# Patient Record
Sex: Male | Born: 1940
Health system: Southern US, Community
[De-identification: ages and names within clinical notes are randomized; demographics above are authoritative.]

## PROBLEM LIST (undated history)

## (undated) DIAGNOSIS — E785 Hyperlipidemia, unspecified: Secondary | ICD-10-CM

## (undated) DIAGNOSIS — R7302 Impaired glucose tolerance (oral): Secondary | ICD-10-CM

## (undated) DIAGNOSIS — E559 Vitamin D deficiency, unspecified: Secondary | ICD-10-CM

## (undated) DIAGNOSIS — Z8249 Family history of ischemic heart disease and other diseases of the circulatory system: Secondary | ICD-10-CM

## (undated) DIAGNOSIS — I493 Ventricular premature depolarization: Secondary | ICD-10-CM

## (undated) DIAGNOSIS — R9431 Abnormal electrocardiogram [ECG] [EKG]: Secondary | ICD-10-CM

## (undated) DIAGNOSIS — N200 Calculus of kidney: Secondary | ICD-10-CM

## (undated) DIAGNOSIS — I10 Essential (primary) hypertension: Secondary | ICD-10-CM

## (undated) HISTORY — DX: Vitamin D deficiency, unspecified: E55.9

## (undated) HISTORY — PX: LITHOTRIPSY: SUR834

## (undated) HISTORY — DX: Family history of ischemic heart disease and other diseases of the circulatory system: Z82.49

## (undated) HISTORY — DX: Impaired glucose tolerance (oral): R73.02

## (undated) HISTORY — DX: Essential (primary) hypertension: I10

## (undated) HISTORY — PX: CYSTOSCOPY/RETROGRADE/URETEROSCOPY/STONE EXTRACTION WITH BASKET: SHX5317

## (undated) HISTORY — DX: Abnormal electrocardiogram (ECG) (EKG): R94.31

## (undated) HISTORY — PX: KIDNEY STONE SURGERY: SHX686

## (undated) HISTORY — DX: Ventricular premature depolarization: I49.3

## (undated) HISTORY — DX: Hyperlipidemia, unspecified: E78.5

---

## 1998-04-01 ENCOUNTER — Ambulatory Visit (HOSPITAL_COMMUNITY): Admission: RE | Admit: 1998-04-01 | Discharge: 1998-04-01 | Payer: Self-pay | Admitting: Cardiovascular Disease

## 1999-04-30 ENCOUNTER — Ambulatory Visit (HOSPITAL_COMMUNITY): Admission: RE | Admit: 1999-04-30 | Discharge: 1999-04-30 | Payer: Self-pay | Admitting: Gastroenterology

## 2002-01-18 ENCOUNTER — Encounter: Payer: Self-pay | Admitting: *Deleted

## 2002-01-19 ENCOUNTER — Ambulatory Visit (HOSPITAL_BASED_OUTPATIENT_CLINIC_OR_DEPARTMENT_OTHER): Admission: RE | Admit: 2002-01-19 | Discharge: 2002-01-19 | Payer: Self-pay | Admitting: *Deleted

## 2002-01-25 ENCOUNTER — Ambulatory Visit (HOSPITAL_COMMUNITY): Admission: RE | Admit: 2002-01-25 | Discharge: 2002-01-25 | Payer: Self-pay | Admitting: *Deleted

## 2002-02-01 ENCOUNTER — Ambulatory Visit (HOSPITAL_COMMUNITY): Admission: RE | Admit: 2002-02-01 | Discharge: 2002-02-01 | Payer: Self-pay | Admitting: *Deleted

## 2002-02-02 ENCOUNTER — Encounter: Payer: Self-pay | Admitting: *Deleted

## 2002-02-02 ENCOUNTER — Ambulatory Visit (HOSPITAL_BASED_OUTPATIENT_CLINIC_OR_DEPARTMENT_OTHER): Admission: RE | Admit: 2002-02-02 | Discharge: 2002-02-02 | Payer: Self-pay | Admitting: *Deleted

## 2002-03-02 ENCOUNTER — Ambulatory Visit (HOSPITAL_BASED_OUTPATIENT_CLINIC_OR_DEPARTMENT_OTHER): Admission: RE | Admit: 2002-03-02 | Discharge: 2002-03-02 | Payer: Self-pay | Admitting: *Deleted

## 2002-03-02 ENCOUNTER — Encounter: Payer: Self-pay | Admitting: *Deleted

## 2002-08-08 ENCOUNTER — Ambulatory Visit (HOSPITAL_COMMUNITY): Admission: RE | Admit: 2002-08-08 | Discharge: 2002-08-08 | Payer: Self-pay | Admitting: Gastroenterology

## 2011-11-01 ENCOUNTER — Emergency Department (HOSPITAL_COMMUNITY)
Admission: EM | Admit: 2011-11-01 | Discharge: 2011-11-01 | Disposition: A | Discharge status: Home / Self Care | Payer: BC Managed Care – PPO | Source: Home / Self Care

## 2011-11-01 ENCOUNTER — Encounter: Payer: Self-pay | Admitting: *Deleted

## 2011-11-01 DIAGNOSIS — M316 Other giant cell arteritis: Secondary | ICD-10-CM

## 2011-11-01 HISTORY — DX: Essential (primary) hypertension: I10

## 2011-11-01 HISTORY — DX: Calculus of kidney: N20.0

## 2011-11-01 LAB — SEDIMENTATION RATE: Sed Rate: 22 mm/hr — ABNORMAL HIGH (ref 0–16)

## 2011-11-01 MED ORDER — METHYLPREDNISOLONE SODIUM SUCC 125 MG IJ SOLR
INTRAMUSCULAR | Status: AC
  Filled 2011-11-01: qty 2

## 2011-11-01 MED ORDER — PREDNISONE 10 MG PO TABS: ORAL_TABLET | ORAL | Status: DC

## 2011-11-01 MED ORDER — METHYLPREDNISOLONE SODIUM SUCC 125 MG IJ SOLR
125.0000 mg | Freq: Once | INTRAMUSCULAR | Status: AC
  Administered 2011-11-01: 125 mg via INTRAMUSCULAR

## 2011-11-01 NOTE — ED Notes (Signed)
Pt with c/o pain right temple area onset x 1.5 weeks called his dr Thursday started on azithromycin - denies sinus congestion - pt had tooth pulled Monday - pain head started same time as toothache

## 2011-11-01 NOTE — ED Provider Notes (Signed)
History     CSN: [id number]  Arrival date & time 11/01/11  4656   First MD Initiated Contact with Patient 11/01/11 [phone]      Chief Complaint  Patient presents with  . Headache    (Consider location/radiation/quality/duration/timing/severity/associated sxs/prior treatment) HPI Comments: Pt c/o Rt temple HA x 1- 1 1/2 weeks. Pain is constant though wax and wanes in intensity. Throbbing discomfort. Also has noticed that his temple is tender to the touch. Pain currently 4, but at worst is a 7. No radiation of pain, dizziness or parasthesias. Denies blurred or dble vision or other visual changes. No change in hearing or tinnitus. He has hydrocodone from a recent tooth extraction that he has been taking for pain with some relief. No progression or worsening noted.   Patient is a 70 y.o. male presenting with headaches.  Headache The primary symptoms include headaches. Primary symptoms do not include dizziness or fever.  The headache is not associated with photophobia, eye pain or weakness.  Additional symptoms do not include weakness, photophobia, hearing loss or tinnitus.    Past Medical History  Diagnosis Date  . Hypertension   . Kidney stone     Past Surgical History  Procedure Date  . Kidney stone surgery     History reviewed. No pertinent family history.  History  Substance Use Topics  . Smoking status: Never Smoker   . Smokeless tobacco: Not on file  . Alcohol Use: No      Review of Systems  Constitutional: Negative for fever, chills and appetite change.  HENT: Negative for hearing loss, ear pain, congestion, sore throat, facial swelling, mouth sores, neck pain, sinus pressure and tinnitus.   Eyes: Negative for photophobia, pain and visual disturbance.  Respiratory: Negative for cough, chest tightness and shortness of breath.   Cardiovascular: Negative for chest pain.  Neurological: Positive for headaches. Negative for dizziness, weakness and numbness.     Allergies  Review of patient's allergies indicates no known allergies.  Home Medications   Current Outpatient Rx  Name Route Sig Dispense Refill  . FENOFIBRATE 145 MG PO TABS Oral Take 145 mg by mouth daily.      . FENOFIBRATE 50 MG PO TABS Oral Take 60 mg by mouth daily.      Marland Kitchen VALSARTAN-HYDROCHLOROTHIAZIDE 160-12.5 MG PO TABS Oral Take 1 tablet by mouth daily.      Marland Kitchen PREDNISONE 10 MG PO TABS  Take 3 tabs daily x 2 days, then 2 tabs daily 12 tablet 0    BP 179/72  Pulse 65  Temp(Src) 98.9 F (37.2 C) (Oral)  Resp 17  SpO2 100%  Physical Exam  Nursing note and vitals reviewed. Constitutional: He appears well-developed and well-nourished. No distress.  HENT:  Head: Normocephalic and atraumatic.  Right Ear: Tympanic membrane, external ear and ear canal normal.  Left Ear: Tympanic membrane, external ear and ear canal normal.  Nose: Nose normal.  Mouth/Throat: Uvula is midline, oropharynx is clear and moist and mucous membranes are normal. No oropharyngeal exudate, posterior oropharyngeal edema or posterior oropharyngeal erythema.  Eyes: Conjunctivae, EOM and lids are normal.  Fundoscopic exam:      The right eye shows no hemorrhage and no papilledema.       The left eye shows no hemorrhage and no papilledema.  Neck: Neck supple.  Cardiovascular: Normal rate, regular rhythm and normal heart sounds.   Pulmonary/Chest: Effort normal and breath sounds normal. No respiratory distress.  Lymphadenopathy:    He  has no cervical adenopathy.  Neurological: He is alert. He has normal strength. No cranial nerve deficit. Gait normal.  Reflex Scores:      Bicep reflexes are 2+ on the right side and 2+ on the left side. Skin: Skin is warm and dry.  Psychiatric: He has a normal mood and affect.    ED Course  Procedures (including critical care time)   Labs Reviewed  SEDIMENTATION RATE   No results found.   1. Temporal arteritis       MDM  Rt temporal HA - suspected  temporal arteritis. No visual changes.        Carmel [place], [place] 11/01/11 1219

## 2011-11-01 NOTE — ED Provider Notes (Signed)
Medical screening examination/treatment/procedure(s) were performed by non-physician practitioner and as supervising physician I was immediately available for consultation/collaboration.  Francina Ames, MD 11/01/11 [phone]

## 2011-11-02 NOTE — ED Notes (Signed)
Sed Rate 22 H. Shown to [place]. Roselyn Meier 11/02/2011

## 2015-05-17 DIAGNOSIS — Z125 Encounter for screening for malignant neoplasm of prostate: Secondary | ICD-10-CM | POA: Diagnosis not present

## 2015-05-24 DIAGNOSIS — N5201 Erectile dysfunction due to arterial insufficiency: Secondary | ICD-10-CM | POA: Diagnosis not present

## 2015-05-24 DIAGNOSIS — R3916 Straining to void: Secondary | ICD-10-CM | POA: Diagnosis not present

## 2015-05-24 DIAGNOSIS — N401 Enlarged prostate with lower urinary tract symptoms: Secondary | ICD-10-CM | POA: Diagnosis not present

## 2015-10-24 DIAGNOSIS — Z23 Encounter for immunization: Secondary | ICD-10-CM | POA: Diagnosis not present

## 2015-10-24 DIAGNOSIS — E784 Other hyperlipidemia: Secondary | ICD-10-CM | POA: Diagnosis not present

## 2015-10-24 DIAGNOSIS — I10 Essential (primary) hypertension: Secondary | ICD-10-CM | POA: Diagnosis not present

## 2015-10-24 DIAGNOSIS — Z0000 Encounter for general adult medical examination without abnormal findings: Secondary | ICD-10-CM | POA: Diagnosis not present

## 2015-10-24 DIAGNOSIS — R7309 Other abnormal glucose: Secondary | ICD-10-CM | POA: Diagnosis not present

## 2016-11-11 ENCOUNTER — Other Ambulatory Visit: Payer: Self-pay

## 2016-11-11 NOTE — Telephone Encounter (Signed)
SENT NOTES TO SCHEDULING 

## 2016-11-24 ENCOUNTER — Encounter (INDEPENDENT_AMBULATORY_CARE_PROVIDER_SITE_OTHER): Payer: Self-pay

## 2016-11-24 ENCOUNTER — Encounter: Payer: Self-pay | Admitting: Cardiology

## 2016-11-24 ENCOUNTER — Ambulatory Visit (INDEPENDENT_AMBULATORY_CARE_PROVIDER_SITE_OTHER): Payer: Medicare Other | Admitting: Cardiology

## 2016-11-24 VITALS — BP 138/80 | HR 68 | Ht 69.0 in | Wt 183.0 lb

## 2016-11-24 DIAGNOSIS — I10 Essential (primary) hypertension: Secondary | ICD-10-CM | POA: Diagnosis not present

## 2016-11-24 DIAGNOSIS — R9431 Abnormal electrocardiogram [ECG] [EKG]: Secondary | ICD-10-CM | POA: Diagnosis not present

## 2016-11-24 DIAGNOSIS — Z8249 Family history of ischemic heart disease and other diseases of the circulatory system: Secondary | ICD-10-CM

## 2016-11-24 DIAGNOSIS — E7800 Pure hypercholesterolemia, unspecified: Secondary | ICD-10-CM | POA: Diagnosis not present

## 2016-11-24 DIAGNOSIS — E782 Mixed hyperlipidemia: Secondary | ICD-10-CM | POA: Insufficient documentation

## 2016-11-24 DIAGNOSIS — R7302 Impaired glucose tolerance (oral): Secondary | ICD-10-CM | POA: Insufficient documentation

## 2016-11-24 DIAGNOSIS — E785 Hyperlipidemia, unspecified: Secondary | ICD-10-CM

## 2016-11-24 DIAGNOSIS — I493 Ventricular premature depolarization: Secondary | ICD-10-CM

## 2016-11-24 DIAGNOSIS — E559 Vitamin D deficiency, unspecified: Secondary | ICD-10-CM | POA: Insufficient documentation

## 2016-11-24 HISTORY — DX: Hyperlipidemia, unspecified: E78.5

## 2016-11-24 HISTORY — DX: Essential (primary) hypertension: I10

## 2016-11-24 HISTORY — DX: Abnormal electrocardiogram (ECG) (EKG): R94.31

## 2016-11-24 HISTORY — DX: Family history of ischemic heart disease and other diseases of the circulatory system: Z82.49

## 2016-11-24 HISTORY — DX: Ventricular premature depolarization: I49.3

## 2016-11-24 NOTE — Progress Notes (Signed)
Cardiology Office Note    Date:  11/24/2016   ID:  Eddie Hutchinson, DOB 03/02/1941, MRN [id number]  PCP:  Salena Saner., MD  Cardiologist:  Fransico Him, MD   No chief complaint on file.   History of Present Illness:  Eddie Hutchinson is a 76 y.o. male with a history of HTN, hyperlipidemia and glucose intolerance who presents who recently saw his PCP for routine OV and was noted to have an abnormal EKG with LAFB, LVH and PVCs.  He is a former smoker and has a family history of his father having am MI at 57   His Framingham risk score was calculated at >30 and is now referred for evaluation.  He denies any chest pain or pressure.  He denies any SOB, DOE, PND, orthopnea, LE edema, dizziness, palpitations or syncope.  He denies any claudication symptoms.    Past Medical History:  Diagnosis Date  . Abnormal EKG 11/24/2016   LAFB with LVH and QRS widening  . Benign essential HTN 11/24/2016  . Family history of early CAD 11/24/2016  . Glucose intolerance (impaired glucose tolerance)   . Hyperlipidemia   . Hyperlipidemia 11/24/2016  . Hypertension   . Kidney stone   . PVC's (premature ventricular contractions) 11/24/2016  . Vitamin D deficiency     Past Surgical History:  Procedure Laterality Date  . CYSTOSCOPY/RETROGRADE/URETEROSCOPY/STONE EXTRACTION WITH BASKET    . KIDNEY STONE SURGERY    . LITHOTRIPSY      Current Medications: Outpatient Medications Prior to Visit  Medication Sig Dispense Refill  . valsartan-hydrochlorothiazide (DIOVAN-HCT) 160-12.5 MG per tablet Take 1 tablet by mouth daily.      . fenofibrate (TRICOR) 145 MG tablet Take 145 mg by mouth daily.      . fenofibrate (TRIGLIDE) 50 MG tablet Take 60 mg by mouth daily.      . predniSONE (DELTASONE) 10 MG tablet Take 3 tabs daily x 2 days, then 2 tabs daily 12 tablet 0   No facility-administered medications prior to visit.      Allergies:   Bee venom   Social History   Social History  . Marital status:  Married    Spouse name: N/A  . Number of children: N/A  . Years of education: N/A   Social History Main Topics  . Smoking status: Former Research scientist (life sciences)  . Smokeless tobacco: Never Used  . Alcohol use Yes  . Drug use: No  . Sexual activity: Not Asked   Other Topics Concern  . None   Social History Narrative  . None     Family History:  The patient's family history includes CVA in his brother; Cancer in his brother; Heart attack (age of onset: 13) in his father; Heart disease in his father; Heart failure in his mother.   ROS:   Please see the history of present illness.    ROS All other systems reviewed and are negative.  No flowsheet data found.     PHYSICAL EXAM:   VS:  BP 138/80   Pulse 68   Ht $R'5\' 9"'dP$  (1.753 m)    GEN: Well nourished, well developed, in no acute distress  HEENT: normal  Neck: no JVD, carotid bruits, or masses Cardiac: RRR; no murmurs, rubs, or gallops,no edema.  Intact distal pulses bilaterally.  Respiratory:  clear to auscultation bilaterally, normal work of breathing GI: soft, nontender, nondistended, + BS MS: no deformity or atrophy  Skin: warm and dry, no rash Neuro:  Alert  and Oriented x 3, Strength and sensation are intact Psych: euthymic mood, full affect  Wt Readings from Last 3 Encounters:  No data found for Wt      Studies/Labs Reviewed:   EKG:  EKG is ordered today.  The ekg ordered today demonstrates NSR at 65bpm with LAFB and LVH with QRS widening  Recent Labs: No results found for requested labs within last 8760 hours.   Lipid Panel No results found for: CHOL, TRIG, HDL, CHOLHDL, VLDL, LDLCALC, LDLDIRECT  Additional studies/ records that were reviewed today include:  Office notes from PCP    ASSESSMENT:    1. Abnormal EKG   2. Benign essential HTN   3. Pure hypercholesterolemia   4. Family history of early CAD   65. PVC's (premature ventricular contractions)      PLAN:  In order of problems listed above:  1.  Abnormal  EKG with LVH with QRS widening and LAFB.  He has no anginal symptoms but has multiple CRFs including HTN, hyperlipidemia, family history of CAD at an early age and remote tobacco use.  I will get a Lexiscan myoview to rule out ischemia.  I will also get a 2D echo to assess LVF and EF.   2.  HTN - BP controlled on current meds.  Continue ARB and diuretic.  3.  Hyperlipidemia - followed by PCP - continue fenofibrate. 4.  Family history of CAD - see #1 5.  PVC's - noted on EKG at PCP office and asymptomatic.   Medication Adjustments/Labs and Tests Ordered: Current medicines are reviewed at length with the patient today.  Concerns regarding medicines are outlined above.  Medication changes, Labs and Tests ordered today are listed in the Patient Instructions below.  There are no Patient Instructions on file for this visit.   Signed, Fransico Him, MD  11/24/2016 9:21 AM    [address], [place], [place]  [address] Phone: [phone]; Fax: [phone]

## 2016-11-24 NOTE — Patient Instructions (Signed)
Medication Instructions:  Your physician recommends that you continue on your current medications as directed. Please refer to the Current Medication list given to you today.   Labwork: None  Testing/Procedures: Your physician has requested that you have an echocardiogram. Echocardiography is a painless test that uses sound waves to create images of your heart. It provides your doctor with information about the size and shape of your heart and how well your heart's chambers and valves are working. This procedure takes approximately one hour. There are no restrictions for this procedure.   Dr. Linsey recommends you have a NUCLEAR STRESS TEST.  Follow-Up: Your physician recommends that you schedule a follow-up appointment AS NEEDED with Dr. Hitchcock pending study results.  Any Other Special Instructions Will Be Listed Below (If Applicable).     If you need a refill on your cardiac medications before your next appointment, please call your pharmacy.   

## 2016-12-02 ENCOUNTER — Encounter: Payer: Self-pay | Admitting: Cardiology

## 2016-12-08 ENCOUNTER — Telehealth (HOSPITAL_COMMUNITY): Payer: Self-pay | Admitting: *Deleted

## 2016-12-08 NOTE — Telephone Encounter (Signed)
Left message on voicemail per DPR in reference to upcoming appointment scheduled on 12/10/16 at 0730 with detailed instructions given per Myocardial Perfusion Study Information Sheet for the test. LM to arrive 15 minutes early, and that it is imperative to arrive on time for appointment to keep from having the test rescheduled. If you need to cancel or reschedule your appointment, please call the office within 24 hours of your appointment. Failure to do so may result in a cancellation of your appointment, and a $50 no show fee. Phone number given for call back for any questions.

## 2016-12-10 ENCOUNTER — Other Ambulatory Visit: Payer: Self-pay

## 2016-12-10 ENCOUNTER — Ambulatory Visit (HOSPITAL_BASED_OUTPATIENT_CLINIC_OR_DEPARTMENT_OTHER): Payer: Medicare Other

## 2016-12-10 ENCOUNTER — Ambulatory Visit (HOSPITAL_COMMUNITY): Payer: Medicare Other | Attending: Cardiovascular Disease

## 2016-12-10 DIAGNOSIS — Z8249 Family history of ischemic heart disease and other diseases of the circulatory system: Secondary | ICD-10-CM

## 2016-12-10 DIAGNOSIS — R9431 Abnormal electrocardiogram [ECG] [EKG]: Secondary | ICD-10-CM | POA: Insufficient documentation

## 2016-12-10 LAB — MYOCARDIAL PERFUSION IMAGING
CHL CUP RESTING HR STRESS: 50 {beats}/min
LVDIAVOL: 116 mL (ref 62–150)
LVSYSVOL: 57 mL
NUC STRESS TID: 0.96
Peak HR: 96 {beats}/min
RATE: 0.28
SDS: 2
SRS: 6
SSS: 8

## 2016-12-10 LAB — ECHOCARDIOGRAM COMPLETE
HEIGHTINCHES: 69 in
Weight: 2928 oz

## 2016-12-10 MED ORDER — TECHNETIUM TC 99M TETROFOSMIN IV KIT
10.2000 | PACK | Freq: Once | INTRAVENOUS | Status: AC | PRN
  Administered 2016-12-10: 10.2 via INTRAVENOUS
  Filled 2016-12-10: qty 11

## 2016-12-10 MED ORDER — TECHNETIUM TC 99M TETROFOSMIN IV KIT
32.6000 | PACK | Freq: Once | INTRAVENOUS | Status: AC | PRN
  Administered 2016-12-10: 32.6 via INTRAVENOUS
  Filled 2016-12-10: qty 33

## 2016-12-10 MED ORDER — REGADENOSON 0.4 MG/5ML IV SOLN
0.4000 mg | Freq: Once | INTRAVENOUS | Status: AC
  Administered 2016-12-10: 0.4 mg via INTRAVENOUS

## 2018-10-20 ENCOUNTER — Other Ambulatory Visit: Payer: Self-pay | Admitting: Nurse Practitioner

## 2018-10-20 MED ORDER — VALSARTAN-HYDROCHLOROTHIAZIDE 160-12.5 MG PO TABS: 1.0000 | ORAL_TABLET | Freq: Every day | ORAL | 0 refills | Status: DC

## 2018-11-07 ENCOUNTER — Encounter: Payer: Self-pay | Admitting: Nurse Practitioner

## 2018-11-07 ENCOUNTER — Ambulatory Visit (INDEPENDENT_AMBULATORY_CARE_PROVIDER_SITE_OTHER): Payer: Medicare Other | Admitting: Nurse Practitioner

## 2018-11-07 VITALS — BP 140/68 | HR 61 | Temp 98.4°F | Ht 68.2 in | Wt 187.8 lb

## 2018-11-07 DIAGNOSIS — E782 Mixed hyperlipidemia: Secondary | ICD-10-CM | POA: Diagnosis not present

## 2018-11-07 DIAGNOSIS — Z125 Encounter for screening for malignant neoplasm of prostate: Secondary | ICD-10-CM | POA: Diagnosis not present

## 2018-11-07 DIAGNOSIS — R7309 Other abnormal glucose: Secondary | ICD-10-CM | POA: Diagnosis not present

## 2018-11-07 DIAGNOSIS — Z0000 Encounter for general adult medical examination without abnormal findings: Secondary | ICD-10-CM | POA: Diagnosis not present

## 2018-11-07 DIAGNOSIS — Z23 Encounter for immunization: Secondary | ICD-10-CM | POA: Diagnosis not present

## 2018-11-07 DIAGNOSIS — I10 Essential (primary) hypertension: Secondary | ICD-10-CM | POA: Diagnosis not present

## 2018-11-07 LAB — POCT URINALYSIS DIPSTICK
BILIRUBIN UA: NEGATIVE
Blood, UA: NEGATIVE
Glucose, UA: NEGATIVE
KETONES UA: NEGATIVE
Leukocytes, UA: NEGATIVE
Nitrite, UA: NEGATIVE
PROTEIN UA: NEGATIVE
Spec Grav, UA: 1.025 (ref 1.010–1.025)
Urobilinogen, UA: 0.2 E.U./dL
pH, UA: 5.5 (ref 5.0–8.0)

## 2018-11-07 LAB — POCT UA - MICROALBUMIN
Albumin/Creatinine Ratio, Urine, POC: 30
CREATININE, POC: 300 mg/dL
Microalbumin Ur, POC: 30 mg/L

## 2018-11-07 MED ORDER — ZOSTER VAC RECOMB ADJUVANTED 50 MCG/0.5ML IM SUSR: 0.5000 mL | Freq: Once | INTRAMUSCULAR | 0 refills | Status: AC

## 2018-11-07 MED ORDER — PNEUMOCOCCAL 13-VAL CONJ VACC IM SUSP: 0.5000 mL | INTRAMUSCULAR | 0 refills | Status: AC

## 2018-11-07 NOTE — Patient Instructions (Signed)
  Eddie Hutchinson , Thank you for taking time to come for your Medicare Wellness Visit. I appreciate your ongoing commitment to your health goals. Please review the following plan we discussed and let me know if I can assist you in the future.   These are the goals we discussed: Goals    . Exercise 150 min/wk Moderate Activity     Would like to exercise more.         This is a list of the screening recommended for you and due dates:  Health Maintenance  Topic Date Due  . Tetanus Vaccine  05/03/1961  . Pneumonia vaccines (1 of 2 - PCV13) 05/04/2007  . Flu Shot  06/09/2018

## 2018-11-07 NOTE — Progress Notes (Signed)
Subjective:     Patient ID: Eddie Hutchinson , male    DOB: 1941/05/21 , 77 y.o.   MRN: [id number]   Chief Complaint  Patient presents with  . Medicare Wellness   Men's preventive visit. Patient Health Questionnaire (PHQ-2) is    Clinical Support from 11/07/2018 in [facility]  PHQ-2 Total Score  0    . Patient is on a regular diet. Marital status: Married. Relevant history for alcohol use is:  Social History   Substance and Sexual Activity  Alcohol Use Yes  . Relevant history for tobacco use is:  Social History   Tobacco Use  Smoking Status Former Smoker  Smokeless Tobacco Never Used    HPI  Hypertension  This is a chronic problem. The current episode started more than 1 year ago. The problem is unchanged. The problem is controlled. Pertinent negatives include no anxiety, chest pain, headaches, malaise/fatigue, palpitations or shortness of breath. There are no associated agents to hypertension. Risk factors for coronary artery disease include male gender, obesity and sedentary lifestyle. Past treatments include angiotensin blockers and diuretics. The current treatment provides significant improvement. Compliance problems include exercise.  There is no history of angina or kidney disease. There is no history of chronic renal disease.     Past Medical History:  Diagnosis Date  . Abnormal EKG 11/24/2016   LAFB with LVH and QRS widening  . Benign essential HTN 11/24/2016  . Family history of early CAD 11/24/2016  . Glucose intolerance (impaired glucose tolerance)   . Hyperlipidemia   . Hyperlipidemia 11/24/2016  . Hypertension   . Kidney stone   . PVC's (premature ventricular contractions) 11/24/2016  . Vitamin D deficiency      Family History  Problem Relation Age of Onset  . Heart failure Mother   . Heart attack Father 24  . Heart disease Father   . CVA Brother   . Cancer Brother      Current Outpatient Medications:  .  Ergocalciferol (VITAMIN  D2) 2000 units TABS, Take 2,000 Units by mouth daily., Disp: , Rfl:  .  fenofibrate 160 MG tablet, Take 160 mg by mouth daily., Disp: , Rfl: 4 .  valsartan-hydrochlorothiazide (DIOVAN-HCT) 160-12.5 MG tablet, Take 1 tablet by mouth daily., Disp: 90 tablet, Rfl: 0   Allergies  Allergen Reactions  . Bee Venom Hives    As a child      Review of Systems  Constitutional: Negative.  Negative for malaise/fatigue.  HENT: Negative.   Eyes: Negative.   Respiratory: Negative.  Negative for shortness of breath.   Cardiovascular: Negative.  Negative for chest pain, palpitations and leg swelling.  Gastrointestinal: Negative.   Endocrine: Negative.   Genitourinary: Negative.   Musculoskeletal: Negative.   Skin: Negative.   Allergic/Immunologic: Negative.   Neurological: Negative.  Negative for dizziness and headaches.  Hematological: Negative.   Psychiatric/Behavioral: Negative.      Today's Vitals   11/07/18 0849  BP: 140/68  Pulse: 61  Temp: 98.4 F (36.9 C)  TempSrc: Oral  SpO2: 97%  Weight: 187 lb 12.8 oz (85.2 kg)  Height: 5' 8.2" (1.732 m)  PainSc: 0-No pain   Body mass index is 28.39 kg/m.   Objective:  Physical Exam Vitals signs reviewed.  Constitutional:      Appearance: Normal appearance. He is obese.  HENT:     Head: Normocephalic and atraumatic.     Right Ear: Tympanic membrane, ear canal and external ear normal. There is  no impacted cerumen.     Left Ear: Tympanic membrane, ear canal and external ear normal. There is no impacted cerumen.     Nose: Nose normal. No congestion or rhinorrhea.     Mouth/Throat:     Mouth: Mucous membranes are moist.  Eyes:     Extraocular Movements: Extraocular movements intact.     Conjunctiva/sclera: Conjunctivae normal.     Pupils: Pupils are equal, round, and reactive to light.  Neck:     Musculoskeletal: Normal range of motion and neck supple. No muscular tenderness.  Cardiovascular:     Rate and Rhythm: Normal rate and  regular rhythm.     Heart sounds: No murmur.  Pulmonary:     Effort: Pulmonary effort is normal.     Breath sounds: Normal breath sounds.  Abdominal:     General: Abdomen is flat.  Musculoskeletal: Normal range of motion.  Skin:    General: Skin is warm and dry.     Capillary Refill: Capillary refill takes less than 2 seconds.  Neurological:     General: No focal deficit present.     Mental Status: He is alert and oriented to person, place, and time.  Psychiatric:        Mood and Affect: Mood normal.         Assessment And Plan:     1. Encounter for Medicare annual wellness exam . Behavior modifications discussed and diet history reviewed.   . Pt will continue to exercise regularly and modify diet with low GI, plant based foods and decrease intake of processed foods.  . Recommend intake of daily multivitamin, Vitamin D, and calcium.  . Recommend for preventive screenings, as well as recommend immunizations that include influenza, TDAP (up to date) and Shingles  2. Encounter for prostate cancer screening  - PSA  3. Encounter for immunization  Sent prescription for Shingrix to pharmacy and advised to not get the prevnar 13 and shingrix on the same day.  4. Benign essential HTN . B/P is controlled.  . CMP ordered to check renal function.  . The importance of regular exercise and dietary modification was stressed to the patient.  . Stressed importance of losing ten percent of her body weight to help with B/P control.  . The weight loss would help with decreasing cardiac and cancer risk as well.  - EKG 12-Lead - POCT Urinalysis Dipstick (81002) - POCT UA - Microalbumin - Basic metabolic panel  5. Mixed hyperlipidemia  Chronic, controlled  Continue with current medications - Hepatic function panel - Lipid panel  6. Abnormal glucose  Chronic, stable  No current medications  Encouraged to limit intake of sugary foods and drinks  Encouraged to increase physical  activity to 150 minutes per week - Hemoglobin A1c      [name], FNP    Subjective:   Eddie Hutchinson is a 77 y.o. male who presents for Medicare Annual/Subsequent preventive examination.  Review of Systems:  See above       Objective:    Vitals: BP 140/68 (BP Location: Right Arm, Patient Position: Sitting, Cuff Size: Large)   Pulse 61   Temp 98.4 F (36.9 C) (Oral)   Ht 5' 8.2" (1.732 m)   Wt 187 lb 12.8 oz (85.2 kg)   SpO2 97%   BMI 28.39 kg/m   Body mass index is 28.39 kg/m.  No flowsheet data found.  Tobacco Social History   Tobacco Use  Smoking Status Former Smoker  Smokeless Tobacco Never Used     Counseling given: Not Answered   Clinical Intake:     Pain Score: 0-No pain                 Past Medical History:  Diagnosis Date  . Abnormal EKG 11/24/2016   LAFB with LVH and QRS widening  . Benign essential HTN 11/24/2016  . Family history of early CAD 11/24/2016  . Glucose intolerance (impaired glucose tolerance)   . Hyperlipidemia   . Hyperlipidemia 11/24/2016  . Hypertension   . Kidney stone   . PVC's (premature ventricular contractions) 11/24/2016  . Vitamin D deficiency    Past Surgical History:  Procedure Laterality Date  . CYSTOSCOPY/RETROGRADE/URETEROSCOPY/STONE EXTRACTION WITH BASKET    . KIDNEY STONE SURGERY    . LITHOTRIPSY     Family History  Problem Relation Age of Onset  . Heart failure Mother   . Heart attack Father 58  . Heart disease Father   . CVA Brother   . Cancer Brother    Social History   Socioeconomic History  . Marital status: Married    Spouse name: Not on file  . Number of children: Not on file  . Years of education: Not on file  . Highest education level: Not on file  Occupational History  . Not on file  Social Needs  . Financial resource strain: Not on file  . Food insecurity:    Worry: Not on file    Inability: Not on file  . Transportation needs:    Medical: Not on file     Non-medical: Not on file  Tobacco Use  . Smoking status: Former Research scientist (life sciences)  . Smokeless tobacco: Never Used  Substance and Sexual Activity  . Alcohol use: Yes  . Drug use: No  . Sexual activity: Not on file  Lifestyle  . Physical activity:    Days per week: Not on file    Minutes per session: Not on file  . Stress: Not on file  Relationships  . Social connections:    Talks on phone: Not on file    Gets together: Not on file    Attends religious service: Not on file    Active member of club or organization: Not on file    Attends meetings of clubs or organizations: Not on file    Relationship status: Not on file  Other Topics Concern  . Not on file  Social History Narrative  . Not on file    Outpatient Encounter Medications as of 11/07/2018  Medication Sig  . Ergocalciferol (VITAMIN D2) 2000 units TABS Take 2,000 Units by mouth daily.  . fenofibrate 160 MG tablet Take 160 mg by mouth daily.  . valsartan-hydrochlorothiazide (DIOVAN-HCT) 160-12.5 MG tablet Take 1 tablet by mouth daily.   No facility-administered encounter medications on file as of 11/07/2018.     Activities of Daily Living In your present state of health, do you have any difficulty performing the following activities: 11/07/2018  Hearing? N  Vision? N  Difficulty concentrating or making decisions? N  Walking or climbing stairs? N  Dressing or bathing? N  Doing errands, shopping? N  Some recent data might be hidden    Patient Care Team: [name], FNP as PCP - General (General Practice)   Assessment:   This is a routine wellness examination for Aristotle.  Exercise Activities and Dietary recommendations    Goals   None     Fall Risk Fall Risk  11/07/2018  Falls in the past year? 0   Is the patient's home free of loose throw rugs in walkways, pet beds, electrical cords, etc?   yes      Grab bars in the bathroom? yes      Handrails on the stairs?   NA      Adequate lighting?   yes  Timed  Get Up and Go Performed: less than 3 seconds  Depression Screen PHQ 2/9 Scores 11/07/2018  PHQ - 2 Score 0    Cognitive Function       Mini-Cog - 11/07/18 0847    Normal clock drawing test?  yes    How many words correct?  3       Immunization History  Administered Date(s) Administered  . Influenza-Unspecified 10/08/2018  . Pneumococcal-Unspecified 07/14/2012  . Tdap 05/30/2010    Qualifies for Shingles Vaccine? Has had previously.    Screening Tests Health Maintenance  Topic Date Due  . TETANUS/TDAP  05/03/1961  . PNA vac Low Risk Adult (1 of 2 - PCV13) 05/04/2007  . INFLUENZA VACCINE  06/09/2018   Cancer Screenings: Lung: Low Dose CT Chest recommended if Age 76-80 years, 30 pack-year currently smoking OR have quit w/in 15years. Patient does not qualify. Colorectal: aged out   Additional Screenings:  Hepatitis C Screening:       Plan:   See above  I have personally reviewed and noted the following in the patient's chart:   . Medical and social history . Use of alcohol, tobacco or illicit drugs  . Current medications and supplements . Functional ability and status . Nutritional status . Physical activity . Advanced directives . List of other physicians . Hospitalizations, surgeries, and ER visits in previous 12 months . Vitals . Screenings to include cognitive, depression, and falls . Referrals and appointments  In addition, I have reviewed and discussed with patient certain preventive protocols, quality metrics, and best practice recommendations. A written personalized care plan for preventive services as well as general preventive health recommendations were provided to patient.     [name], FNP  11/07/2018

## 2018-11-08 LAB — HEPATIC FUNCTION PANEL
ALT: 16 IU/L (ref 0–44)
AST: 18 IU/L (ref 0–40)
Albumin: 4.2 g/dL (ref 3.5–4.8)
Alkaline Phosphatase: 36 IU/L — ABNORMAL LOW (ref 39–117)
BILIRUBIN TOTAL: 0.2 mg/dL (ref 0.0–1.2)
BILIRUBIN, DIRECT: 0.09 mg/dL (ref 0.00–0.40)
TOTAL PROTEIN: 6.6 g/dL (ref 6.0–8.5)

## 2018-11-08 LAB — PSA: Prostate Specific Ag, Serum: 1 ng/mL (ref 0.0–4.0)

## 2018-11-08 LAB — LIPID PANEL
CHOLESTEROL TOTAL: 133 mg/dL (ref 100–199)
Chol/HDL Ratio: 3.2 ratio (ref 0.0–5.0)
HDL: 42 mg/dL (ref >39)
LDL CALC: 75 mg/dL (ref 0–99)
TRIGLYCERIDES: 79 mg/dL (ref 0–149)
VLDL CHOLESTEROL CAL: 16 mg/dL (ref 5–40)

## 2018-11-08 LAB — BASIC METABOLIC PANEL
BUN / CREAT RATIO: 12 (ref 10–24)
BUN: 22 mg/dL (ref 8–27)
CALCIUM: 10.5 mg/dL — AB (ref 8.6–10.2)
CHLORIDE: 106 mmol/L (ref 96–106)
CO2: 22 mmol/L (ref 20–29)
CREATININE: 1.88 mg/dL — AB (ref 0.76–1.27)
GFR calc Af Amer: 39 mL/min/{1.73_m2} — ABNORMAL LOW (ref >59)
GFR calc non Af Amer: 34 mL/min/{1.73_m2} — ABNORMAL LOW (ref >59)
GLUCOSE: 90 mg/dL (ref 65–99)
Potassium: 4.9 mmol/L (ref 3.5–5.2)
Sodium: 144 mmol/L (ref 134–144)

## 2018-11-08 LAB — HEMOGLOBIN A1C
ESTIMATED AVERAGE GLUCOSE: 134 mg/dL
Hgb A1c MFr Bld: 6.3 % — ABNORMAL HIGH (ref 4.8–5.6)

## 2018-11-28 ENCOUNTER — Telehealth: Payer: Self-pay

## 2018-11-28 NOTE — Telephone Encounter (Signed)
Mr. Hyland called back and I have relayed the lab results to him. He did not have any other questions.   Thanks, [employer]

## 2018-11-30 ENCOUNTER — Other Ambulatory Visit: Payer: Self-pay | Admitting: Nurse Practitioner

## 2018-11-30 DIAGNOSIS — N183 Chronic kidney disease, stage 3 unspecified: Secondary | ICD-10-CM

## 2018-11-30 DIAGNOSIS — I10 Essential (primary) hypertension: Secondary | ICD-10-CM

## 2019-01-16 ENCOUNTER — Other Ambulatory Visit: Payer: Self-pay | Admitting: Nurse Practitioner

## 2019-01-21 ENCOUNTER — Other Ambulatory Visit: Payer: Self-pay | Admitting: Nurse Practitioner

## 2019-04-12 ENCOUNTER — Other Ambulatory Visit: Payer: Self-pay | Admitting: Nephrology

## 2019-04-12 DIAGNOSIS — N183 Chronic kidney disease, stage 3 unspecified: Secondary | ICD-10-CM

## 2019-04-15 ENCOUNTER — Other Ambulatory Visit: Payer: Self-pay | Admitting: Nurse Practitioner

## 2019-04-19 ENCOUNTER — Other Ambulatory Visit: Payer: Self-pay | Admitting: Nurse Practitioner

## 2019-04-24 ENCOUNTER — Ambulatory Visit
Admission: RE | Admit: 2019-04-24 | Discharge: 2019-04-24 | Disposition: A | Discharge status: Home / Self Care | Payer: Medicare Other | Source: Ambulatory Visit | Attending: Nephrology | Admitting: Nephrology

## 2019-04-24 DIAGNOSIS — N183 Chronic kidney disease, stage 3 unspecified: Secondary | ICD-10-CM

## 2019-05-08 ENCOUNTER — Encounter: Payer: Self-pay | Admitting: Nurse Practitioner

## 2019-05-08 ENCOUNTER — Ambulatory Visit: Payer: Medicare Other | Admitting: Nurse Practitioner

## 2019-05-08 ENCOUNTER — Other Ambulatory Visit: Payer: Self-pay

## 2019-05-08 VITALS — BP 184/86

## 2019-05-08 DIAGNOSIS — Z23 Encounter for immunization: Secondary | ICD-10-CM

## 2019-05-08 DIAGNOSIS — F4321 Adjustment disorder with depressed mood: Secondary | ICD-10-CM | POA: Insufficient documentation

## 2019-05-08 DIAGNOSIS — E782 Mixed hyperlipidemia: Secondary | ICD-10-CM | POA: Diagnosis not present

## 2019-05-08 DIAGNOSIS — N183 Chronic kidney disease, stage 3 unspecified: Secondary | ICD-10-CM

## 2019-05-08 DIAGNOSIS — I129 Hypertensive chronic kidney disease with stage 1 through stage 4 chronic kidney disease, or unspecified chronic kidney disease: Secondary | ICD-10-CM

## 2019-05-08 DIAGNOSIS — R7309 Other abnormal glucose: Secondary | ICD-10-CM | POA: Diagnosis not present

## 2019-05-08 DIAGNOSIS — I10 Essential (primary) hypertension: Secondary | ICD-10-CM

## 2019-05-08 MED ORDER — PNEUMOCOCCAL 13-VAL CONJ VACC IM SUSP: 0.5000 mL | INTRAMUSCULAR | 0 refills | Status: AC

## 2019-05-08 NOTE — Progress Notes (Addendum)
Virtual Visit via Video     This visit type was conducted due to national recommendations for restrictions regarding the COVID-19 Pandemic (e.g. social distancing) in an effort to limit this patient's exposure and mitigate transmission in our community.  Patients identity confirmed using two different identifiers.  This format is felt to be most appropriate for this patient at this time.  All issues noted in this document were discussed and addressed.  No physical exam was performed (except for noted visual exam findings with Video Visits).    Date:  05/08/2019   ID:  Eddie Hutchinson, DOB Jan 01, 1941, MRN [id number]  Patient Location:  Home - spoke with Gretchen Portela  Provider location:   Office    Chief Complaint:  Hypertension follow up  History of Present Illness:    Eddie Hutchinson is a 78 y.o. male who presents via video conferencing for a telehealth visit today.    The patient does not have symptoms concerning for COVID-19 infection (fever, chills, cough, or new shortness of breath).   Hypertension This is a chronic problem. The current episode started more than 1 year ago. The problem has been gradually worsening since onset. The problem is uncontrolled. Pertinent negatives include no anxiety or headaches. There are no associated agents to hypertension. Risk factors for coronary artery disease include sedentary lifestyle. Past treatments include calcium channel blockers.     Past Medical History:  Diagnosis Date  . Abnormal EKG 11/24/2016   LAFB with LVH and QRS widening  . Benign essential HTN 11/24/2016  . Family history of early CAD 11/24/2016  . Glucose intolerance (impaired glucose tolerance)   . Hyperlipidemia   . Hyperlipidemia 11/24/2016  . Hypertension   . Kidney stone   . PVC's (premature ventricular contractions) 11/24/2016  . Vitamin D deficiency    Past Surgical History:  Procedure Laterality Date  . CYSTOSCOPY/RETROGRADE/URETEROSCOPY/STONE EXTRACTION WITH  BASKET    . KIDNEY STONE SURGERY    . LITHOTRIPSY       Current Meds  Medication Sig  . amLODipine (NORVASC) 5 MG tablet Take 5 mg by mouth daily.  . fenofibrate 160 MG tablet TAKE 1 TABLET BY MOUTH EVERY DAY     Allergies:   Bee venom   Social History   Tobacco Use  . Smoking status: Former Research scientist (life sciences)  . Smokeless tobacco: Never Used  Substance Use Topics  . Alcohol use: Yes  . Drug use: No     Family Hx: The patient's family history includes CVA in his brother; Cancer in his brother; Heart attack (age of onset: 38) in his father; Heart disease in his father; Heart failure in his mother.  ROS:   Please see the history of present illness.    Review of Systems  Constitutional: Negative.   Respiratory: Negative.  Negative for cough.   Cardiovascular: Negative.   Neurological: Negative for dizziness and headaches.  Psychiatric/Behavioral:       A little sad today due to his wife passing and anniversary    All other systems reviewed and are negative.   Labs/Other Tests and Data Reviewed:    Recent Labs: 11/07/2018: ALT 16; BUN 22; Creatinine, Ser 1.88; Potassium 4.9; Sodium 144   Recent Lipid Panel Lab Results  Component Value Date/Time   CHOL 133 11/07/2018 09:31 AM   TRIG 79 11/07/2018 09:31 AM   HDL 42 11/07/2018 09:31 AM   CHOLHDL 3.2 11/07/2018 09:31 AM   LDLCALC 75 11/07/2018 09:31 AM  Wt Readings from Last 3 Encounters:  11/07/18 187 lb 12.8 oz (85.2 kg)  12/10/16 183 lb (83 kg)  11/24/16 183 lb (83 kg)     Exam:    Vital Signs:  BP (!) 184/86 Comment: taking at home    Physical Exam  Constitutional: He is oriented to person, place, and time and well-developed, well-nourished, and in no distress.  Cardiovascular:  No JVD distention  Pulmonary/Chest: Effort normal.  Musculoskeletal:        General: No edema.  Neurological: He is alert and oriented to person, place, and time.  Psychiatric: Mood, memory, affect and judgment normal.     ASSESSMENT & PLAN:   1. Benign essential HTN  Chronic, fair control  Elevated this morning his repeat blood pressure is better at 168/62  He also has not taken his medications   2. Stage 3 chronic kidney disease (HCC)  Chronic, stable  Stay well hydrated and avoid nephrotoxic drugs  3. Mixed hyperlipidemia  Chronic, controlled  No current medications  Continue to avoid fried and fatty foods  4. Abnormal glucose  Chronic, stable  Encouraged to limit intake of sugary foods and drinks  5. Grief  Recent loss of his wife a few months ago and his anniversary was last week.  Offered grief counseling however declined at this time  6. Encounter for immunization  prevnar 13 Rx sent to pharmacy he is aware to get at pharmacy - pneumococcal 13-valent conjugate vaccine (PREVNAR 13) SUSP injection; Inject 0.5 mLs into the muscle tomorrow at 10 am for 1 dose.  Dispense: 0.5 mL; Refill: 0    COVID-19 Education: The signs and symptoms of COVID-19 were discussed with the patient and how to seek care for testing (follow up with PCP or arrange E-visit).  The importance of social distancing was discussed today.  Patient Risk:   After full review of this patients clinical status, I feel that they are at least moderate risk at this time.  Time:   Today, I have spent 11 minutes/ seconds with the patient with telehealth technology discussing above diagnoses.     Medication Adjustments/Labs and Tests Ordered: Current medicines are reviewed at length with the patient today.  Concerns regarding medicines are outlined above.   Tests Ordered: Orders Placed This Encounter  Procedures  . BMP8+eGFR  . Hemoglobin A1c    Medication Changes: Meds ordered this encounter  Medications  . pneumococcal 13-valent conjugate vaccine (PREVNAR 13) SUSP injection    Sig: Inject 0.5 mLs into the muscle tomorrow at 10 am for 1 dose.    Dispense:  0.5 mL    Refill:  0    Disposition:  Follow up  in 3 month(s) and labs in am at 10  Signed, [name], FNP

## 2019-05-09 ENCOUNTER — Other Ambulatory Visit: Payer: Self-pay

## 2019-05-09 ENCOUNTER — Other Ambulatory Visit: Payer: Self-pay | Admitting: Nurse Practitioner

## 2019-05-09 ENCOUNTER — Other Ambulatory Visit: Payer: Medicare Other

## 2019-05-09 LAB — HGB A1C W/O EAG: Hgb A1c MFr Bld: 5.9 % — ABNORMAL HIGH (ref 4.8–5.6)

## 2019-05-10 LAB — BMP8+EGFR
BUN/Creatinine Ratio: 11 (ref 10–24)
BUN: 19 mg/dL (ref 8–27)
CO2: 15 mmol/L — ABNORMAL LOW (ref 20–29)
Calcium: 9.8 mg/dL (ref 8.6–10.2)
Chloride: 111 mmol/L — ABNORMAL HIGH (ref 96–106)
Creatinine, Ser: 1.75 mg/dL — ABNORMAL HIGH (ref 0.76–1.27)
GFR calc Af Amer: 42 mL/min/{1.73_m2} — ABNORMAL LOW (ref >59)
GFR calc non Af Amer: 37 mL/min/{1.73_m2} — ABNORMAL LOW (ref >59)
Glucose: 100 mg/dL — ABNORMAL HIGH (ref 65–99)
Potassium: 5.7 mmol/L — ABNORMAL HIGH (ref 3.5–5.2)
Sodium: 142 mmol/L (ref 134–144)

## 2019-05-30 ENCOUNTER — Ambulatory Visit (INDEPENDENT_AMBULATORY_CARE_PROVIDER_SITE_OTHER): Payer: Medicare Other

## 2019-05-30 ENCOUNTER — Other Ambulatory Visit: Payer: Self-pay

## 2019-05-30 VITALS — BP 152/84 | HR 62 | Temp 98.7°F | Ht 67.8 in | Wt 181.0 lb

## 2019-05-30 DIAGNOSIS — Z23 Encounter for immunization: Secondary | ICD-10-CM | POA: Diagnosis not present

## 2019-05-30 DIAGNOSIS — Z0000 Encounter for general adult medical examination without abnormal findings: Secondary | ICD-10-CM

## 2019-05-30 MED ORDER — PREVNAR 13 IM SUSP
0.5000 mL | INTRAMUSCULAR | 0 refills | Status: AC
Start: 2019-05-30 — End: 2019-05-30

## 2019-05-30 NOTE — Progress Notes (Signed)
Subjective:   Eddie Hutchinson is a 78 y.o. male who presents for Medicare Annual/Subsequent preventive examination.  Review of Systems:  n/a Cardiac Risk Factors include: advanced age (>26men, >37 women);dyslipidemia;hypertension;male gender;sedentary lifestyle     Objective:    Vitals: BP (!) 152/84 (BP Location: Left Arm, Patient Position: Sitting, Cuff Size: Normal)   Pulse 62   Temp 98.7 F (37.1 C) (Oral)   Ht 5' 7.8" (1.722 m)   Wt 181 lb (82.1 kg)   SpO2 98%   BMI 27.68 kg/m   Body mass index is 27.68 kg/m.  Advanced Directives 05/30/2019 11/07/2018 11/07/2018  Does Patient Have a Medical Advance Directive? No - No  Would patient like information on creating a medical advance directive? No - Patient declined Yes (MAU/Ambulatory/Procedural Areas - Information given) Yes (MAU/Ambulatory/Procedural Areas - Information given)    Tobacco Social History   Tobacco Use  Smoking Status Former Smoker  Smokeless Tobacco Never Used     Counseling given: Not Answered   Clinical Intake:  Pre-visit preparation completed: Yes  Pain : No/denies pain     Nutritional Status: BMI 25 -29 Overweight Nutritional Risks: None Diabetes: No  How often do you need to have someone help you when you read instructions, pamphlets, or other written materials from your doctor or pharmacy?: 1 - Never What is the last grade level you completed in school?: 12th grade  Interpreter Needed?: No  Information entered by :: NAllen LPN  Past Medical History:  Diagnosis Date  . Abnormal EKG 11/24/2016   LAFB with LVH and QRS widening  . Benign essential HTN 11/24/2016  . Family history of early CAD 11/24/2016  . Glucose intolerance (impaired glucose tolerance)   . Hyperlipidemia   . Hyperlipidemia 11/24/2016  . Hypertension   . Kidney stone   . PVC's (premature ventricular contractions) 11/24/2016  . Vitamin D deficiency    Past Surgical History:  Procedure Laterality Date  .  CYSTOSCOPY/RETROGRADE/URETEROSCOPY/STONE EXTRACTION WITH BASKET    . KIDNEY STONE SURGERY    . LITHOTRIPSY     Family History  Problem Relation Age of Onset  . Heart failure Mother   . Heart attack Father 31  . Heart disease Father   . CVA Brother   . Cancer Brother    Social History   Socioeconomic History  . Marital status: Widowed    Spouse name: Not on file  . Number of children: Not on file  . Years of education: Not on file  . Highest education level: Not on file  Occupational History  . Occupation: retired  Scientific laboratory technician  . Financial resource strain: Not hard at all  . Food insecurity    Worry: Never true    Inability: Never true  . Transportation needs    Medical: No    Non-medical: No  Tobacco Use  . Smoking status: Former Research scientist (life sciences)  . Smokeless tobacco: Never Used  Substance and Sexual Activity  . Alcohol use: Yes    Comment: seldom  . Drug use: No  . Sexual activity: Not Currently  Lifestyle  . Physical activity    Days per week: 0 days    Minutes per session: 0 min  . Stress: Not at all  Relationships  . Social Herbalist on phone: Not on file    Gets together: Not on file    Attends religious service: Not on file    Active member of club or organization: Not on file  Attends meetings of clubs or organizations: Not on file    Relationship status: Not on file  Other Topics Concern  . Not on file  Social History Narrative  . Not on file    Outpatient Encounter Medications as of 05/30/2019  Medication Sig  . amLODipine (NORVASC) 5 MG tablet Take 5 mg by mouth daily.  . fenofibrate 160 MG tablet TAKE 1 TABLET BY MOUTH EVERY DAY  . Ergocalciferol (VITAMIN D2) 2000 units TABS Take 2,000 Units by mouth daily.  . valsartan-hydrochlorothiazide (DIOVAN-HCT) 160-12.5 MG tablet TAKE 1 TABLET BY MOUTH EVERY DAY (Patient not taking: Reported on 05/08/2019)   No facility-administered encounter medications on file as of 05/30/2019.     Activities of  Daily Living In your present state of health, do you have any difficulty performing the following activities: 05/30/2019 11/07/2018  Hearing? N N  Vision? N N  Difficulty concentrating or making decisions? N N  Walking or climbing stairs? N N  Dressing or bathing? N N  Doing errands, shopping? N N  Preparing Food and eating ? N -  Using the Toilet? N -  In the past six months, have you accidently leaked urine? N -  Do you have problems with loss of bowel control? N -  Managing your Medications? N -  Managing your Finances? N -  Housekeeping or managing your Housekeeping? N -  Some recent data might be hidden    Patient Care Team: Arnette Felts, FNP as PCP - General (General Practice)   Assessment:   This is a routine wellness examination for Eddie Hutchinson.  Exercise Activities and Dietary recommendations Current Exercise Habits: The patient does not participate in regular exercise at present, Exercise limited by: None identified  Goals    . Exercise 150 min/wk Moderate Activity     Would like to exercise more.      . Exercise 150 min/wk Moderate Activity     05/30/2019, patient wants to start exercising       Fall Risk Fall Risk  05/30/2019 05/08/2019 11/07/2018  Falls in the past year? 0 0 0  Number falls in past yr: 0 - -  Risk for fall due to : Medication side effect - -  Follow up Falls evaluation completed;Education provided;Falls prevention discussed - -   Is the patient's home free of loose throw rugs in walkways, pet beds, electrical cords, etc?   yes      Grab bars in the bathroom? no      Handrails on the stairs?   no      Adequate lighting?   yes  Timed Get Up and Go Performed: n/a  Depression Screen PHQ 2/9 Scores 05/30/2019 05/08/2019 11/07/2018  PHQ - 2 Score 2 0 0  PHQ- 9 Score 2 - -    Cognitive Function     6CIT Screen 05/30/2019  What Year? 0 points  What month? 0 points  What time? 3 points  Count back from 20 0 points  Months in reverse 0 points   Repeat phrase 0 points  Total Score 3    Immunization History  Administered Date(s) Administered  . Influenza-Unspecified 10/08/2018  . Pneumococcal-Unspecified 07/14/2012  . Tdap 05/30/2010    Qualifies for Shingles Vaccine? yes  Screening Tests Health Maintenance  Topic Date Due  . PNA vac Low Risk Adult (2 of 2 - PCV13) 07/14/2013  . INFLUENZA VACCINE  06/10/2019  . TETANUS/TDAP  05/30/2020   Cancer Screenings: Lung: Low Dose CT Chest  recommended if Age 68-80 years, 13 pack-year currently smoking OR have quit w/in 15years. Patient does not qualify. Colorectal: up to date  Additional Screenings:  Hepatitis C Screening:n/a      Plan:    6 CIT was three. Patient wants to start exercising.  I have personally reviewed and noted the following in the patient's chart:   . Medical and social history . Use of alcohol, tobacco or illicit drugs  . Current medications and supplements . Functional ability and status . Nutritional status . Physical activity . Advanced directives . List of other physicians . Hospitalizations, surgeries, and ER visits in previous 12 months . Vitals . Screenings to include cognitive, depression, and falls . Referrals and appointments  In addition, I have reviewed and discussed with patient certain preventive protocols, quality metrics, and best practice recommendations. A written personalized care plan for preventive services as well as general preventive health recommendations were provided to patient.     Kellie Simmering, LPN  11/12/1279

## 2019-05-30 NOTE — Patient Instructions (Signed)
Eddie Hutchinson , Thank you for taking time to come for your Medicare Wellness Visit. I appreciate your ongoing commitment to your health goals. Please review the following plan we discussed and let me know if I can assist you in the future.   Screening recommendations/referrals: Colonoscopy: 09/2010 Recommended yearly ophthalmology/optometry visit for glaucoma screening and checkup Recommended yearly dental visit for hygiene and checkup  Vaccinations: Influenza vaccine: 09/2018 Pneumococcal vaccine: sent to pharmacy Tdap vaccine: 05/2010 Shingles vaccine: discussed    Advanced directives: Advance directive discussed with you today. Even though you declined this today please call our office should you change your mind and we can give you the proper paperwork for you to fill out.   Conditions/risks identified: overweight  Next appointment: 08/09/2019 at 9:00  Preventive Care 77 Years and Older, Male Preventive care refers to lifestyle choices and visits with your health care provider that can promote health and wellness. What does preventive care include?  A yearly physical exam. This is also called an annual well check.  Dental exams once or twice a year.  Routine eye exams. Ask your health care provider how often you should have your eyes checked.  Personal lifestyle choices, including:  Daily care of your teeth and gums.  Regular physical activity.  Eating a healthy diet.  Avoiding tobacco and drug use.  Limiting alcohol use.  Practicing safe sex.  Taking low doses of aspirin every day.  Taking vitamin and mineral supplements as recommended by your health care provider. What happens during an annual well check? The services and screenings done by your health care provider during your annual well check will depend on your age, overall health, lifestyle risk factors, and family history of disease. Counseling  Your health care provider may ask you questions about your:   Alcohol use.  Tobacco use.  Drug use.  Emotional well-being.  Home and relationship well-being.  Sexual activity.  Eating habits.  History of falls.  Memory and ability to understand (cognition).  Work and work Astronomer. Screening  You may have the following tests or measurements:  Height, weight, and BMI.  Blood pressure.  Lipid and cholesterol levels. These may be checked every 5 years, or more frequently if you are over 77 years old.  Skin check.  Lung cancer screening. You may have this screening every year starting at age 77 if you have a 30-pack-year history of smoking and currently smoke or have quit within the past 15 years.  Fecal occult blood test (FOBT) of the stool. You may have this test every year starting at age 77.  Flexible sigmoidoscopy or colonoscopy. You may have a sigmoidoscopy every 5 years or a colonoscopy every 10 years starting at age 77.  Prostate cancer screening. Recommendations will vary depending on your family history and other risks.  Hepatitis C blood test.  Hepatitis B blood test.  Sexually transmitted disease (STD) testing.  Diabetes screening. This is done by checking your blood sugar (glucose) after you have not eaten for a while (fasting). You may have this done every 1-3 years.  Abdominal aortic aneurysm (AAA) screening. You may need this if you are a current or former smoker.  Osteoporosis. You may be screened starting at age 77 if you are at high risk. Talk with your health care provider about your test results, treatment options, and if necessary, the need for more tests. Vaccines  Your health care provider may recommend certain vaccines, such as:  Influenza vaccine. This is recommended  every year.  Tetanus, diphtheria, and acellular pertussis (Tdap, Td) vaccine. You may need a Td booster every 10 years.  Zoster vaccine. You may need this after age 77.  Pneumococcal 13-valent conjugate (PCV13) vaccine. One dose is  recommended after age 77.  Pneumococcal polysaccharide (PPSV23) vaccine. One dose is recommended after age 77. Talk to your health care provider about which screenings and vaccines you need and how often you need them. This information is not intended to replace advice given to you by your health care provider. Make sure you discuss any questions you have with your health care provider. Document Released: 11/22/2015 Document Revised: 07/15/2016 Document Reviewed: 08/27/2015 Elsevier Interactive Patient Education  2017 [employer].  Fall Prevention in the Home Falls can cause injuries. They can happen to people of all ages. There are many things you can do to make your home safe and to help prevent falls. What can I do on the outside of my home?  Regularly fix the edges of walkways and driveways and fix any cracks.  Remove anything that might make you trip as you walk through a door, such as a raised step or threshold.  Trim any bushes or trees on the path to your home.  Use bright outdoor lighting.  Clear any walking paths of anything that might make someone trip, such as rocks or tools.  Regularly check to see if handrails are loose or broken. Make sure that both sides of any steps have handrails.  Any raised decks and porches should have guardrails on the edges.  Have any leaves, snow, or ice cleared regularly.  Use sand or salt on walking paths during winter.  Clean up any spills in your garage right away. This includes oil or grease spills. What can I do in the bathroom?  Use night lights.  Install grab bars by the toilet and in the tub and shower. Do not use towel bars as grab bars.  Use non-skid mats or decals in the tub or shower.  If you need to sit down in the shower, use a plastic, non-slip stool.  Keep the floor dry. Clean up any water that spills on the floor as soon as it happens.  Remove soap buildup in the tub or shower regularly.  Attach bath mats  securely with double-sided non-slip rug tape.  Do not have throw rugs and other things on the floor that can make you trip. What can I do in the bedroom?  Use night lights.  Make sure that you have a light by your bed that is easy to reach.  Do not use any sheets or blankets that are too big for your bed. They should not hang down onto the floor.  Have a firm chair that has side arms. You can use this for support while you get dressed.  Do not have throw rugs and other things on the floor that can make you trip. What can I do in the kitchen?  Clean up any spills right away.  Avoid walking on wet floors.  Keep items that you use a lot in easy-to-reach places.  If you need to reach something above you, use a strong step stool that has a grab bar.  Keep electrical cords out of the way.  Do not use floor polish or wax that makes floors slippery. If you must use wax, use non-skid floor wax.  Do not have throw rugs and other things on the floor that can make you trip. What  can I do with my stairs?  Do not leave any items on the stairs.  Make sure that there are handrails on both sides of the stairs and use them. Fix handrails that are broken or loose. Make sure that handrails are as long as the stairways.  Check any carpeting to make sure that it is firmly attached to the stairs. Fix any carpet that is loose or worn.  Avoid having throw rugs at the top or bottom of the stairs. If you do have throw rugs, attach them to the floor with carpet tape.  Make sure that you have a light switch at the top of the stairs and the bottom of the stairs. If you do not have them, ask someone to add them for you. What else can I do to help prevent falls?  Wear shoes that:  Do not have high heels.  Have rubber bottoms.  Are comfortable and fit you well.  Are closed at the toe. Do not wear sandals.  If you use a stepladder:  Make sure that it is fully opened. Do not climb a closed  stepladder.  Make sure that both sides of the stepladder are locked into place.  Ask someone to hold it for you, if possible.  Clearly mark and make sure that you can see:  Any grab bars or handrails.  First and last steps.  Where the edge of each step is.  Use tools that help you move around (mobility aids) if they are needed. These include:  Canes.  Walkers.  Scooters.  Crutches.  Turn on the lights when you go into a dark area. Replace any light bulbs as soon as they burn out.  Set up your furniture so you have a clear path. Avoid moving your furniture around.  If any of your floors are uneven, fix them.  If there are any pets around you, be aware of where they are.  Review your medicines with your doctor. Some medicines can make you feel dizzy. This can increase your chance of falling. Ask your doctor what other things that you can do to help prevent falls. This information is not intended to replace advice given to you by your health care provider. Make sure you discuss any questions you have with your health care provider. Document Released: 08/22/2009 Document Revised: 04/02/2016 Document Reviewed: 11/30/2014 Elsevier Interactive Patient Education  2017 [employer].

## 2019-07-12 ENCOUNTER — Other Ambulatory Visit: Payer: Self-pay | Admitting: Nurse Practitioner

## 2019-08-09 ENCOUNTER — Encounter: Payer: Self-pay | Admitting: Nurse Practitioner

## 2019-08-09 ENCOUNTER — Other Ambulatory Visit: Payer: Self-pay

## 2019-08-09 ENCOUNTER — Ambulatory Visit (INDEPENDENT_AMBULATORY_CARE_PROVIDER_SITE_OTHER): Payer: Medicare Other | Admitting: Nurse Practitioner

## 2019-08-09 VITALS — BP 140/60 | HR 59 | Temp 98.1°F | Ht 67.8 in | Wt 183.6 lb

## 2019-08-09 DIAGNOSIS — R7309 Other abnormal glucose: Secondary | ICD-10-CM

## 2019-08-09 DIAGNOSIS — I10 Essential (primary) hypertension: Secondary | ICD-10-CM

## 2019-08-09 DIAGNOSIS — E782 Mixed hyperlipidemia: Secondary | ICD-10-CM

## 2019-08-09 DIAGNOSIS — N183 Chronic kidney disease, stage 3 unspecified: Secondary | ICD-10-CM

## 2019-08-09 DIAGNOSIS — I129 Hypertensive chronic kidney disease with stage 1 through stage 4 chronic kidney disease, or unspecified chronic kidney disease: Secondary | ICD-10-CM

## 2019-08-09 NOTE — Progress Notes (Addendum)
Subjective:     Patient ID: Eddie Hutchinson , male    DOB: [birth year] , 78 y.o.   MRN: [id number]   Chief Complaint  Patient presents with  . Hypertension    HPI  He will occasionally will have elevated blood pressures he had his amlodipine increased to 10 mg.  He is keeping a log   Hypertension This is a chronic problem. The current episode started more than 1 year ago. The problem is unchanged. The problem is controlled. Pertinent negatives include no anxiety, chest pain, headaches or palpitations. Risk factors for coronary artery disease include sedentary lifestyle. Past treatments include calcium channel blockers. There are no compliance problems.  There is no history of angina. There is no history of chronic renal disease.     Past Medical History:  Diagnosis Date  . Abnormal EKG 11/24/2016   LAFB with LVH and QRS widening  . Benign essential HTN 11/24/2016  . Family history of early CAD 11/24/2016  . Glucose intolerance (impaired glucose tolerance)   . Hyperlipidemia   . Hyperlipidemia 11/24/2016  . Hypertension   . Kidney stone   . PVC's (premature ventricular contractions) 11/24/2016  . Vitamin D deficiency      Family History  Problem Relation Age of Onset  . Heart failure Mother   . Heart attack Father 84  . Heart disease Father   . CVA Brother   . Cancer Brother      Current Outpatient Medications:  .  amLODipine (NORVASC) 10 MG tablet, Take 10 mg by mouth daily. , Disp: , Rfl:  .  Ergocalciferol (VITAMIN D2 PO), Take 1,000 Units by mouth daily. , Disp: , Rfl:  .  fenofibrate 160 MG tablet, TAKE 1 TABLET BY MOUTH EVERY DAY, Disp: 90 tablet, Rfl: 0   Allergies  Allergen Reactions  . Bee Venom Hives    As a child      Review of Systems  Constitutional: Negative.   Respiratory: Negative.   Cardiovascular: Negative.  Negative for chest pain, palpitations and leg swelling.  Neurological: Negative.  Negative for dizziness and headaches.   Psychiatric/Behavioral: Negative.      Today's Vitals   08/09/19 0901  BP: 140/60  Pulse: (!) 59  Temp: 98.1 F (36.7 C)  TempSrc: Oral  Weight: 183 lb 9.6 oz (83.3 kg)  Height: 5' 7.8" (1.722 m)  PainSc: 0-No pain   Body mass index is 28.08 kg/m.   Objective:  Physical Exam Vitals signs reviewed.  Constitutional:      Appearance: Normal appearance.  Cardiovascular:     Rate and Rhythm: Normal rate and regular rhythm.     Pulses: Normal pulses.     Heart sounds: Normal heart sounds. No murmur.  Pulmonary:     Effort: Pulmonary effort is normal.     Breath sounds: Normal breath sounds.  Skin:    Capillary Refill: Capillary refill takes less than 2 seconds.  Neurological:     General: No focal deficit present.     Mental Status: He is alert and oriented to person, place, and time.         Assessment And Plan:    1. Benign essential HTN  Chronic, good control today  He had an increase in his amlodipine by his nephrologist  He has been having intermittent increases in bp, advised to call us with his readings next week  2. Mixed hyperlipidemia  Chronic, controlled  Continue with current medications  3. Abnormal glucose  Chronic, improved at last visit  Continue to limit intake of sugary foods and drinks.   4. Stage 3 chronic kidney disease  Continue with follow up with Dr. Harrie Jeans  Will obtain labs from his most recent office visit on 02/14/6807 to avoid duplicating.    [name], FNP    THE PATIENT IS ENCOURAGED TO PRACTICE SOCIAL DISTANCING DUE TO THE COVID-19 PANDEMIC.

## 2019-09-22 ENCOUNTER — Telehealth: Payer: Self-pay | Admitting: Nurse Practitioner

## 2019-09-22 NOTE — Telephone Encounter (Signed)
I spoke with the patient, and he agreed to come in at 8:30 instead of 9:00 on 11/15/2019.

## 2019-10-28 ENCOUNTER — Other Ambulatory Visit: Payer: Self-pay | Admitting: Nurse Practitioner

## 2019-11-15 ENCOUNTER — Ambulatory Visit: Payer: Medicare PPO | Admitting: Nurse Practitioner

## 2019-11-15 ENCOUNTER — Other Ambulatory Visit: Payer: Self-pay

## 2019-11-15 ENCOUNTER — Ambulatory Visit: Payer: Medicare Other

## 2019-11-15 ENCOUNTER — Ambulatory Visit: Payer: Medicare Other | Admitting: Nurse Practitioner

## 2019-11-15 ENCOUNTER — Encounter: Payer: Self-pay | Admitting: Nurse Practitioner

## 2019-11-15 ENCOUNTER — Ambulatory Visit (INDEPENDENT_AMBULATORY_CARE_PROVIDER_SITE_OTHER): Payer: Medicare PPO

## 2019-11-15 VITALS — BP 138/76 | HR 82 | Temp 98.5°F | Ht 68.8 in | Wt 188.4 lb

## 2019-11-15 VITALS — BP 138/76 | HR 82 | Temp 98.5°F | Ht 68.8 in | Wt 188.0 lb

## 2019-11-15 DIAGNOSIS — R001 Bradycardia, unspecified: Secondary | ICD-10-CM

## 2019-11-15 DIAGNOSIS — E782 Mixed hyperlipidemia: Secondary | ICD-10-CM | POA: Diagnosis not present

## 2019-11-15 DIAGNOSIS — I10 Essential (primary) hypertension: Secondary | ICD-10-CM

## 2019-11-15 DIAGNOSIS — Z20822 Contact with and (suspected) exposure to covid-19: Secondary | ICD-10-CM

## 2019-11-15 DIAGNOSIS — I129 Hypertensive chronic kidney disease with stage 1 through stage 4 chronic kidney disease, or unspecified chronic kidney disease: Secondary | ICD-10-CM

## 2019-11-15 DIAGNOSIS — J3489 Other specified disorders of nose and nasal sinuses: Secondary | ICD-10-CM

## 2019-11-15 DIAGNOSIS — Z0000 Encounter for general adult medical examination without abnormal findings: Secondary | ICD-10-CM

## 2019-11-15 DIAGNOSIS — N1832 Chronic kidney disease, stage 3b: Secondary | ICD-10-CM | POA: Insufficient documentation

## 2019-11-15 DIAGNOSIS — R7309 Other abnormal glucose: Secondary | ICD-10-CM

## 2019-11-15 LAB — POCT URINALYSIS DIPSTICK
Bilirubin, UA: NEGATIVE
Blood, UA: NEGATIVE
Glucose, UA: NEGATIVE
Ketones, UA: NEGATIVE
Leukocytes, UA: NEGATIVE
Nitrite, UA: NEGATIVE
Protein, UA: POSITIVE — AB
Spec Grav, UA: >=1.03 — AB (ref 1.010–1.025)
Urobilinogen, UA: 0.2 E.U./dL
pH, UA: 5.5 (ref 5.0–8.0)

## 2019-11-15 LAB — POCT UA - MICROALBUMIN
Creatinine, POC: 300 mg/dL
Microalbumin Ur, POC: 150 mg/L

## 2019-11-15 NOTE — Progress Notes (Addendum)
This visit occurred during the SARS-CoV-2 public health emergency.  Safety protocols were in place, including screening questions prior to the visit, additional usage of staff PPE, and extensive cleaning of exam room while observing appropriate contact time as indicated for disinfecting solutions.  Subjective:     Patient ID: Eddie Hutchinson , male    DOB: 11-Nov-1940 , 79 y.o.   MRN: [id number]   Chief Complaint  Patient presents with  . Annual Exam    HPI  Here for HM, he had his Medicare AWV with [facility] L[facility] as well.   He continues to have   Hypertension This is a chronic problem. The current episode started more than 1 year ago. The problem has been gradually worsening since onset. The problem is uncontrolled. Pertinent negatives include no anxiety, headaches (frontal head pressure) or shortness of breath. There are no associated agents to hypertension. Risk factors for coronary artery disease include sedentary lifestyle. Past treatments include calcium channel blockers. Compliance problems include exercise.  There is no history of angina, kidney disease or CAD/MI. There is no history of chronic renal disease.  Sinus Problem This is a new (intermittent at times.  He has been around his family in the last 2 weeks ) problem. The current episode started in the past 7 days. The problem is unchanged. There has been no fever. Associated symptoms include congestion and coughing. Pertinent negatives include no chills, headaches (frontal head pressure), shortness of breath or sore throat. Past treatments include oral decongestants. The treatment provided no relief.     Men's preventive visit. Patient Health Questionnaire (PHQ-2) is    Clinical Support from 11/15/2019 in [facility]  PHQ-2 Total Score  2     Patient is on a Regular diet. Minimal exercise due to the cold weather and the current Pandemic.  Marital status: Widowed. Relevant history for alcohol use is:   Social History   Substance and Sexual Activity  Alcohol Use Yes   Comment: occassionally   Relevant history for tobacco use is:  Social History   Tobacco Use  Smoking Status Former Smoker  Smokeless Tobacco Never Used   Past Medical History:  Diagnosis Date  . Abnormal EKG 11/24/2016   LAFB with LVH and QRS widening  . Benign essential HTN 11/24/2016  . Family history of early CAD 11/24/2016  . Glucose intolerance (impaired glucose tolerance)   . Hyperlipidemia   . Hyperlipidemia 11/24/2016  . Hypertension   . Kidney stone   . PVC's (premature ventricular contractions) 11/24/2016  . Vitamin D deficiency      Family History  Problem Relation Age of Onset  . Heart failure Mother   . Heart attack Father 28  . Heart disease Father   . CVA Brother   . Cancer Brother      Current Outpatient Medications:  .  amLODipine (NORVASC) 10 MG tablet, Take 10 mg by mouth daily. , Disp: , Rfl:  .  Ergocalciferol (VITAMIN D2 PO), Take 1,000 Units by mouth daily. , Disp: , Rfl:  .  fenofibrate 160 MG tablet, TAKE 1 TABLET BY MOUTH EVERY DAY, Disp: 90 tablet, Rfl: 0   Allergies  Allergen Reactions  . Bee Venom Hives    As a child      Review of Systems  Constitutional: Negative for chills, fatigue and fever.  HENT: Positive for congestion. Negative for postnasal drip, rhinorrhea and sore throat.   Respiratory: Positive for cough. Negative for shortness of breath.  Neurological: Negative for headaches (frontal head pressure).     Today's Vitals   11/15/19 0851  BP: 138/76  Pulse: 82  Temp: 98.5 F (36.9 C)  TempSrc: Oral  Weight: 188 lb (85.3 kg)  Height: 5' 8.8" (1.748 m)  PainSc: 0-No pain   Body mass index is 27.92 kg/m.   Objective:  Physical Exam Vitals reviewed.  Constitutional:      Appearance: Normal appearance. He is obese.  HENT:     Head: Normocephalic and atraumatic.  Eyes:     Extraocular Movements: Extraocular movements intact.      Conjunctiva/sclera: Conjunctivae normal.     Pupils: Pupils are equal, round, and reactive to light.  Cardiovascular:     Rate and Rhythm: Normal rate and regular rhythm.     Pulses: Normal pulses.     Heart sounds: Normal heart sounds.  Pulmonary:     Effort: Pulmonary effort is normal. No respiratory distress.     Breath sounds: Normal breath sounds.  Abdominal:     General: Abdomen is flat. Bowel sounds are normal. There is no distension.     Palpations: Abdomen is soft.  Genitourinary:    Prostate: Normal.     Rectum: Guaiac result negative.  Musculoskeletal:        General: Normal range of motion.     Cervical back: Normal range of motion and neck supple.  Skin:    General: Skin is warm.     Capillary Refill: Capillary refill takes less than 2 seconds.  Neurological:     General: No focal deficit present.     Mental Status: He is alert and oriented to person, place, and time.     Cranial Nerves: No cranial nerve deficit.  Psychiatric:        Mood and Affect: Mood normal.        Behavior: Behavior normal.        Thought Content: Thought content normal.        Judgment: Judgment normal.         Assessment And Plan:     1. Health maintenance examination . Behavior modifications discussed and diet history reviewed.   . Pt will continue to exercise regularly and modify diet with low GI, plant based foods and decrease intake of processed foods.  . Recommend intake of daily multivitamin, Vitamin D, and calcium.  . Recommend for preventive screenings, as well as recommend immunizations that include influenza, TDAP (both are up to date) - CBC without diff  2. Benign essential HTN  Chronic, great control  Continue with current medications  EKG done with Sinus Bradycardia - anterior fascicular block.   - EKG 12-Lead - CMP14+EGFR  3. Stage 3b chronic kidney disease  Continue with follow up with Dr. Harrie Jeans  Encouraged to avoid NSAIDs and stay well hydrated with  water  4. Mixed hyperlipidemia  Chronic, good control  Continue with current medications - Lipid Profile  5. Abnormal glucose  Chronic, no current medications   Will check HgbA1c.   - Hemoglobin A1c  6. Sinus drainage  Given sample of allegra  I did test him for coronavirus due to being around family approximately 7 days ago during the holidays without a mask.    No known exposure - Novel Coronavirus, NAA ([facility])     [name], FNP    THE PATIENT IS ENCOURAGED TO PRACTICE SOCIAL DISTANCING DUE TO THE COVID-19 PANDEMIC.

## 2019-11-15 NOTE — Patient Instructions (Signed)
Eddie Hutchinson , Thank you for taking time to come for your Medicare Wellness Visit. I appreciate your ongoing commitment to your health goals. Please review the following plan we discussed and let me know if I can assist you in the future.   Screening recommendations/referrals: Colonoscopy: 09/2010 Recommended yearly ophthalmology/optometry visit for glaucoma screening and checkup Recommended yearly dental visit for hygiene and checkup  Vaccinations: Influenza vaccine: 06/2019 Pneumococcal vaccine: 05/2019 Tdap vaccine: 05/2019 Shingles vaccine: discussed    Advanced directives: Advance directive discussed with you today. Even though you declined this today please call our office should you change your mind and we can give you the proper paperwork for you to fill out.   Conditions/risks identified: overweight  Next appointment: 06/05/2020 at 9:30  Preventive Care 4 Years and Older, Male Preventive care refers to lifestyle choices and visits with your health care provider that can promote health and wellness. What does preventive care include?  A yearly physical exam. This is also called an annual well check.  Dental exams once or twice a year.  Routine eye exams. Ask your health care provider how often you should have your eyes checked.  Personal lifestyle choices, including:  Daily care of your teeth and gums.  Regular physical activity.  Eating a healthy diet.  Avoiding tobacco and drug use.  Limiting alcohol use.  Practicing safe sex.  Taking low doses of aspirin every day.  Taking vitamin and mineral supplements as recommended by your health care provider. What happens during an annual well check? The services and screenings done by your health care provider during your annual well check will depend on your age, overall health, lifestyle risk factors, and family history of disease. Counseling  Your health care provider may ask you questions about your:  Alcohol  use.  Tobacco use.  Drug use.  Emotional well-being.  Home and relationship well-being.  Sexual activity.  Eating habits.  History of falls.  Memory and ability to understand (cognition).  Work and work Statistician. Screening  You may have the following tests or measurements:  Height, weight, and BMI.  Blood pressure.  Lipid and cholesterol levels. These may be checked every 5 years, or more frequently if you are over 25 years old.  Skin check.  Lung cancer screening. You may have this screening every year starting at age 22 if you have a 30-pack-year history of smoking and currently smoke or have quit within the past 15 years.  Fecal occult blood test (FOBT) of the stool. You may have this test every year starting at age 30.  Flexible sigmoidoscopy or colonoscopy. You may have a sigmoidoscopy every 5 years or a colonoscopy every 10 years starting at age 53.  Prostate cancer screening. Recommendations will vary depending on your family history and other risks.  Hepatitis C blood test.  Hepatitis B blood test.  Sexually transmitted disease (STD) testing.  Diabetes screening. This is done by checking your blood sugar (glucose) after you have not eaten for a while (fasting). You may have this done every 1-3 years.  Abdominal aortic aneurysm (AAA) screening. You may need this if you are a current or former smoker.  Osteoporosis. You may be screened starting at age 64 if you are at high risk. Talk with your health care provider about your test results, treatment options, and if necessary, the need for more tests. Vaccines  Your health care provider may recommend certain vaccines, such as:  Influenza vaccine. This is recommended every year.  Tetanus, diphtheria, and acellular pertussis (Tdap, Td) vaccine. You may need a Td booster every 10 years.  Zoster vaccine. You may need this after age 73.  Pneumococcal 13-valent conjugate (PCV13) vaccine. One dose is  recommended after age 55.  Pneumococcal polysaccharide (PPSV23) vaccine. One dose is recommended after age 7. Talk to your health care provider about which screenings and vaccines you need and how often you need them. This information is not intended to replace advice given to you by your health care provider. Make sure you discuss any questions you have with your health care provider. Document Released: 11/22/2015 Document Revised: 07/15/2016 Document Reviewed: 08/27/2015 Elsevier Interactive Patient Education  2017 [place] Prevention in the Home Falls can cause injuries. They can happen to people of all ages. There are many things you can do to make your home safe and to help prevent falls. What can I do on the outside of my home?  Regularly fix the edges of walkways and driveways and fix any cracks.  Remove anything that might make you trip as you walk through a door, such as a raised step or threshold.  Trim any bushes or trees on the path to your home.  Use bright outdoor lighting.  Clear any walking paths of anything that might make someone trip, such as rocks or tools.  Regularly check to see if handrails are loose or broken. Make sure that both sides of any steps have handrails.  Any raised decks and porches should have guardrails on the edges.  Have any leaves, snow, or ice cleared regularly.  Use sand or salt on walking paths during winter.  Clean up any spills in your garage right away. This includes oil or grease spills. What can I do in the bathroom?  Use night lights.  Install grab bars by the toilet and in the tub and shower. Do not use towel bars as grab bars.  Use non-skid mats or decals in the tub or shower.  If you need to sit down in the shower, use a plastic, non-slip stool.  Keep the floor dry. Clean up any water that spills on the floor as soon as it happens.  Remove soap buildup in the tub or shower regularly.  Attach bath mats  securely with double-sided non-slip rug tape.  Do not have throw rugs and other things on the floor that can make you trip. What can I do in the bedroom?  Use night lights.  Make sure that you have a light by your bed that is easy to reach.  Do not use any sheets or blankets that are too big for your bed. They should not hang down onto the floor.  Have a firm chair that has side arms. You can use this for support while you get dressed.  Do not have throw rugs and other things on the floor that can make you trip. What can I do in the kitchen?  Clean up any spills right away.  Avoid walking on wet floors.  Keep items that you use a lot in easy-to-reach places.  If you need to reach something above you, use a strong step stool that has a grab bar.  Keep electrical cords out of the way.  Do not use floor polish or wax that makes floors slippery. If you must use wax, use non-skid floor wax.  Do not have throw rugs and other things on the floor that can make you trip. What can I do  with my stairs?  Do not leave any items on the stairs.  Make sure that there are handrails on both sides of the stairs and use them. Fix handrails that are broken or loose. Make sure that handrails are as long as the stairways.  Check any carpeting to make sure that it is firmly attached to the stairs. Fix any carpet that is loose or worn.  Avoid having throw rugs at the top or bottom of the stairs. If you do have throw rugs, attach them to the floor with carpet tape.  Make sure that you have a light switch at the top of the stairs and the bottom of the stairs. If you do not have them, ask someone to add them for you. What else can I do to help prevent falls?  Wear shoes that:  Do not have high heels.  Have rubber bottoms.  Are comfortable and fit you well.  Are closed at the toe. Do not wear sandals.  If you use a stepladder:  Make sure that it is fully opened. Do not climb a closed  stepladder.  Make sure that both sides of the stepladder are locked into place.  Ask someone to hold it for you, if possible.  Clearly mark and make sure that you can see:  Any grab bars or handrails.  First and last steps.  Where the edge of each step is.  Use tools that help you move around (mobility aids) if they are needed. These include:  Canes.  Walkers.  Scooters.  Crutches.  Turn on the lights when you go into a dark area. Replace any light bulbs as soon as they burn out.  Set up your furniture so you have a clear path. Avoid moving your furniture around.  If any of your floors are uneven, fix them.  If there are any pets around you, be aware of where they are.  Review your medicines with your doctor. Some medicines can make you feel dizzy. This can increase your chance of falling. Ask your doctor what other things that you can do to help prevent falls. This information is not intended to replace advice given to you by your health care provider. Make sure you discuss any questions you have with your health care provider. Document Released: 08/22/2009 Document Revised: 04/02/2016 Document Reviewed: 11/30/2014 Elsevier Interactive Patient Education  2017 [employer].

## 2019-11-15 NOTE — Patient Instructions (Signed)
Health Maintenance After Age 78 After age 78, you are at a higher risk for certain long-term diseases and infections as well as injuries from falls. Falls are a major cause of broken bones and head injuries in people who are older than age 78. Getting regular preventive care can help to keep you healthy and well. Preventive care includes getting regular testing and making lifestyle changes as recommended by your health care provider. Talk with your health care provider about:  Which screenings and tests you should have. A screening is a test that checks for a disease when you have no symptoms.  A diet and exercise plan that is right for you. What should I know about screenings and tests to prevent falls? Screening and testing are the best ways to find a health problem early. Early diagnosis and treatment give you the best chance of managing medical conditions that are common after age 78. Certain conditions and lifestyle choices may make you more likely to have a fall. Your health care provider may recommend:  Regular vision checks. Poor vision and conditions such as cataracts can make you more likely to have a fall. If you wear glasses, make sure to get your prescription updated if your vision changes.  Medicine review. Work with your health care provider to regularly review all of the medicines you are taking, including over-the-counter medicines. Ask your health care provider about any side effects that may make you more likely to have a fall. Tell your health care provider if any medicines that you take make you feel dizzy or sleepy.  Osteoporosis screening. Osteoporosis is a condition that causes the bones to get weaker. This can make the bones weak and cause them to break more easily.  Blood pressure screening. Blood pressure changes and medicines to control blood pressure can make you feel dizzy.  Strength and balance checks. Your health care provider may recommend certain tests to check your  strength and balance while standing, walking, or changing positions.  Foot health exam. Foot pain and numbness, as well as not wearing proper footwear, can make you more likely to have a fall.  Depression screening. You may be more likely to have a fall if you have a fear of falling, feel emotionally low, or feel unable to do activities that you used to do.  Alcohol use screening. Using too much alcohol can affect your balance and may make you more likely to have a fall. What actions can I take to lower my risk of falls? General instructions  Talk with your health care provider about your risks for falling. Tell your health care provider if: ? You fall. Be sure to tell your health care provider about all falls, even ones that seem minor. ? You feel dizzy, sleepy, or off-balance.  Take over-the-counter and prescription medicines only as told by your health care provider. These include any supplements.  Eat a healthy diet and maintain a healthy weight. A healthy diet includes low-fat dairy products, low-fat (lean) meats, and fiber from whole grains, beans, and lots of fruits and vegetables. Home safety  Remove any tripping hazards, such as rugs, cords, and clutter.  Install safety equipment such as grab bars in bathrooms and safety rails on stairs.  Keep rooms and walkways well-lit. Activity   Follow a regular exercise program to stay fit. This will help you maintain your balance. Ask your health care provider what types of exercise are appropriate for you.  If you need a cane or   walker, use it as recommended by your health care provider.  Wear supportive shoes that have nonskid soles. Lifestyle  Do not drink alcohol if your health care provider tells you not to drink.  If you drink alcohol, limit how much you have: ? 0-1 drink a day for women. ? 0-2 drinks a day for men.  Be aware of how much alcohol is in your drink. In the [place] one drink equals one typical bottle of beer (12  oz), one-half glass of wine (5 oz), or one shot of hard liquor (1 oz).  Do not use any products that contain nicotine or tobacco, such as cigarettes and e-cigarettes. If you need help quitting, ask your health care provider. Summary  Having a healthy lifestyle and getting preventive care can help to protect your health and wellness after age 78.  Screening and testing are the best way to find a health problem early and help you avoid having a fall. Early diagnosis and treatment give you the best chance for managing medical conditions that are more common for people who are older than age 78.  Falls are a major cause of broken bones and head injuries in people who are older than age 78. Take precautions to prevent a fall at home.  Work with your health care provider to learn what changes you can make to improve your health and wellness and to prevent falls. This information is not intended to replace advice given to you by your health care provider. Make sure you discuss any questions you have with your health care provider. Document Revised: 02/16/2019 Document Reviewed: 09/08/2017 Elsevier Patient Education  2020 [employer]  

## 2019-11-15 NOTE — Progress Notes (Signed)
This visit occurred during the SARS-CoV-2 public health emergency.  Safety protocols were in place, including screening questions prior to the visit, additional usage of staff PPE, and extensive cleaning of exam room while observing appropriate contact time as indicated for disinfecting solutions.  Subjective:   Eddie Hutchinson is a 80 y.o. male who presents for Medicare Annual/Subsequent preventive examination.  Review of Systems:  n/a Cardiac Risk Factors include: advanced age (>64men, >11 women);hypertension;male gender;sedentary lifestyle     Objective:    Vitals: BP 138/76 (BP Location: Left Arm, Patient Position: Sitting, Cuff Size: Normal)   Pulse 82   Temp 98.5 F (36.9 C) (Oral)   Ht 5' 8.8" (1.748 m)   Wt 188 lb 6.4 oz (85.5 kg)   SpO2 99%   BMI 27.98 kg/m   Body mass index is 27.98 kg/m.  Advanced Directives 11/15/2019 05/30/2019 11/07/2018 11/07/2018  Does Patient Have a Medical Advance Directive? No No - No  Would patient like information on creating a medical advance directive? No - Patient declined No - Patient declined Yes (MAU/Ambulatory/Procedural Areas - Information given) Yes (MAU/Ambulatory/Procedural Areas - Information given)    Tobacco Social History   Tobacco Use  Smoking Status Former Smoker  Smokeless Tobacco Never Used     Counseling given: Not Answered   Clinical Intake:  Pre-visit preparation completed: Yes  Pain : No/denies pain     Nutritional Status: BMI 25 -29 Overweight Nutritional Risks: None Diabetes: No  How often do you need to have someone help you when you read instructions, pamphlets, or other written materials from your doctor or pharmacy?: 1 - Never What is the last grade level you completed in school?: 12th grade  Interpreter Needed?: No  Information entered by :: NAllen LPN  Past Medical History:  Diagnosis Date  . Abnormal EKG 11/24/2016   LAFB with LVH and QRS widening  . Benign essential HTN 11/24/2016  .  Family history of early CAD 11/24/2016  . Glucose intolerance (impaired glucose tolerance)   . Hyperlipidemia   . Hyperlipidemia 11/24/2016  . Hypertension   . Kidney stone   . PVC's (premature ventricular contractions) 11/24/2016  . Vitamin D deficiency    Past Surgical History:  Procedure Laterality Date  . CYSTOSCOPY/RETROGRADE/URETEROSCOPY/STONE EXTRACTION WITH BASKET    . KIDNEY STONE SURGERY    . LITHOTRIPSY     Family History  Problem Relation Age of Onset  . Heart failure Mother   . Heart attack Father 78  . Heart disease Father   . CVA Brother   . Cancer Brother    Social History   Socioeconomic History  . Marital status: Widowed    Spouse name: Not on file  . Number of children: Not on file  . Years of education: Not on file  . Highest education level: Not on file  Occupational History  . Occupation: retired  Tobacco Use  . Smoking status: Former Research scientist (life sciences)  . Smokeless tobacco: Never Used  Substance and Sexual Activity  . Alcohol use: Yes    Comment: occassionally  . Drug use: No  . Sexual activity: Not Currently  Other Topics Concern  . Not on file  Social History Narrative  . Not on file   Social Determinants of Health   Financial Resource Strain: Low Risk   . Difficulty of Paying Living Expenses: Not hard at all  Food Insecurity: No Food Insecurity  . Worried About Charity fundraiser in the Last Year: Never true  .  Ran Out of Food in the Last Year: Never true  Transportation Needs: No Transportation Needs  . Lack of Transportation (Medical): No  . Lack of Transportation (Non-Medical): No  Physical Activity: Inactive  . Days of Exercise per Week: 0 days  . Minutes of Exercise per Session: 0 min  Stress: Stress Concern Present  . Feeling of Stress : To some extent  Social Connections:   . Frequency of Communication with Friends and Family: Not on file  . Frequency of Social Gatherings with Friends and Family: Not on file  . Attends Religious  Services: Not on file  . Active Member of Clubs or Organizations: Not on file  . Attends Archivist Meetings: Not on file  . Marital Status: Not on file    Outpatient Encounter Medications as of 11/15/2019  Medication Sig  . amLODipine (NORVASC) 10 MG tablet Take 10 mg by mouth daily.   . Ergocalciferol (VITAMIN D2 PO) Take 1,000 Units by mouth daily.   . fenofibrate 160 MG tablet TAKE 1 TABLET BY MOUTH EVERY DAY   No facility-administered encounter medications on file as of 11/15/2019.    Activities of Daily Living In your present state of health, do you have any difficulty performing the following activities: 11/15/2019 05/30/2019  Hearing? N N  Vision? N N  Difficulty concentrating or making decisions? N N  Walking or climbing stairs? N N  Dressing or bathing? N N  Doing errands, shopping? N N  Preparing Food and eating ? N N  Using the Toilet? N N  In the past six months, have you accidently leaked urine? N N  Do you have problems with loss of bowel control? N N  Managing your Medications? N N  Managing your Finances? N N  Housekeeping or managing your Housekeeping? N N  Some recent data might be hidden    Patient Care Team: [name], FNP as PCP - General (General Practice)   Assessment:   This is a routine wellness examination for Eddie Hutchinson.  Exercise Activities and Dietary recommendations Current Exercise Habits: The patient does not participate in regular exercise at present(mows the lawn once a week)  Goals    . Exercise 150 min/wk Moderate Activity     Would like to exercise more.      . Exercise 150 min/wk Moderate Activity     05/30/2019, patient wants to start exercising    . Patient Stated     11/15/2019, wants to stay active       Fall Risk Fall Risk  11/15/2019 08/09/2019 05/30/2019 05/08/2019 11/07/2018  Falls in the past year? 0 0 0 0 0  Number falls in past yr: - - 0 - -  Risk for fall due to : Medication side effect - Medication side effect -  -  Follow up Education provided;Falls evaluation completed;Falls prevention discussed - Falls evaluation completed;Education provided;Falls prevention discussed - -   Is the patient's home free of loose throw rugs in walkways, pet beds, electrical cords, etc?   yes      Grab bars in the bathroom? no      Handrails on the stairs?   yes      Adequate lighting?   yes  Timed Get Up and Go Performed: n/a  Depression Screen PHQ 2/9 Scores 11/15/2019 08/09/2019 05/30/2019 05/08/2019  PHQ - 2 Score $Remov'2 1 2 'ATuwII$ 0  PHQ- 9 Score 2 - 2 -    Cognitive Function     6CIT  Screen 11/15/2019 05/30/2019  What Year? 0 points 0 points  What month? 0 points 0 points  What time? 0 points 3 points  Count back from 20 0 points 0 points  Months in reverse 0 points 0 points  Repeat phrase 0 points 0 points  Total Score 0 3    Immunization History  Administered Date(s) Administered  . Influenza, High Dose Seasonal PF 07/03/2019  . Influenza-Unspecified 10/08/2018  . Pneumococcal Conjugate-13 06/01/2019  . Pneumococcal-Unspecified 07/14/2012  . Tdap 05/30/2010, 05/18/2019    Qualifies for Shingles Vaccine? yes  Screening Tests Health Maintenance  Topic Date Due  . TETANUS/TDAP  05/17/2029  . INFLUENZA VACCINE  Completed  . PNA vac Low Risk Adult  Completed   Cancer Screenings: Lung: Low Dose CT Chest recommended if Age 32-80 years, 30 pack-year currently smoking OR have quit w/in 15years. Patient does not qualify. Colorectal: up to date  Additional Screenings:  Hepatitis C Screening:n/a      Plan:    Patient wants to stay active.  I have personally reviewed and noted the following in the patient's chart:   . Medical and social history . Use of alcohol, tobacco or illicit drugs  . Current medications and supplements . Functional ability and status . Nutritional status . Physical activity . Advanced directives . List of other physicians . Hospitalizations, surgeries, and ER visits in previous  12 months . Vitals . Screenings to include cognitive, depression, and falls . Referrals and appointments  In addition, I have reviewed and discussed with patient certain preventive protocols, quality metrics, and best practice recommendations. A written personalized care plan for preventive services as well as general preventive health recommendations were provided to patient.     Kellie Simmering, LPN  07/14/7472

## 2019-11-16 LAB — CMP14+EGFR
ALT: 12 IU/L (ref 0–44)
AST: 18 IU/L (ref 0–40)
Albumin/Globulin Ratio: 1.6 (ref 1.2–2.2)
Albumin: 3.9 g/dL (ref 3.7–4.7)
Alkaline Phosphatase: 46 IU/L (ref 39–117)
BUN/Creatinine Ratio: 13 (ref 10–24)
BUN: 24 mg/dL (ref 8–27)
Bilirubin Total: 0.2 mg/dL (ref 0.0–1.2)
CO2: 23 mmol/L (ref 20–29)
Calcium: 9.6 mg/dL (ref 8.6–10.2)
Chloride: 107 mmol/L — ABNORMAL HIGH (ref 96–106)
Creatinine, Ser: 1.81 mg/dL — ABNORMAL HIGH (ref 0.76–1.27)
GFR calc Af Amer: 41 mL/min/{1.73_m2} — ABNORMAL LOW (ref >59)
GFR calc non Af Amer: 35 mL/min/{1.73_m2} — ABNORMAL LOW (ref >59)
Globulin, Total: 2.4 g/dL (ref 1.5–4.5)
Glucose: 87 mg/dL (ref 65–99)
Potassium: 4.6 mmol/L (ref 3.5–5.2)
Sodium: 144 mmol/L (ref 134–144)
Total Protein: 6.3 g/dL (ref 6.0–8.5)

## 2019-11-16 LAB — CBC
Hematocrit: 36.3 % — ABNORMAL LOW (ref 37.5–51.0)
Hemoglobin: 11.7 g/dL — ABNORMAL LOW (ref 13.0–17.7)
MCH: 27.6 pg (ref 26.6–33.0)
MCHC: 32.2 g/dL (ref 31.5–35.7)
MCV: 86 fL (ref 79–97)
Platelets: 596 10*3/uL — ABNORMAL HIGH (ref 150–450)
RBC: 4.24 x10E6/uL (ref 4.14–5.80)
RDW: 16.9 % — ABNORMAL HIGH (ref 11.6–15.4)
WBC: 9.4 10*3/uL (ref 3.4–10.8)

## 2019-11-16 LAB — LIPID PANEL
Chol/HDL Ratio: 3 ratio (ref 0.0–5.0)
Cholesterol, Total: 131 mg/dL (ref 100–199)
HDL: 43 mg/dL (ref >39)
LDL Chol Calc (NIH): 71 mg/dL (ref 0–99)
Triglycerides: 85 mg/dL (ref 0–149)
VLDL Cholesterol Cal: 17 mg/dL (ref 5–40)

## 2019-11-16 LAB — HEMOGLOBIN A1C
Est. average glucose Bld gHb Est-mCnc: 117 mg/dL
Hgb A1c MFr Bld: 5.7 % — ABNORMAL HIGH (ref 4.8–5.6)

## 2019-11-17 LAB — NOVEL CORONAVIRUS, NAA: SARS-CoV-2, NAA: NOT DETECTED

## 2019-12-08 LAB — SPECIMEN STATUS REPORT

## 2019-12-08 LAB — IRON AND TIBC
Iron Saturation: 18 % (ref 15–55)
Iron: 67 ug/dL (ref 38–169)
Total Iron Binding Capacity: 368 ug/dL (ref 250–450)
UIBC: 301 ug/dL (ref 111–343)

## 2019-12-11 ENCOUNTER — Other Ambulatory Visit: Payer: Self-pay | Admitting: Nurse Practitioner

## 2019-12-11 MED ORDER — FERROUS SULFATE 325 (65 FE) MG PO TABS: 325.0000 mg | ORAL_TABLET | Freq: Every day | ORAL | 3 refills | Status: DC

## 2019-12-11 NOTE — Progress Notes (Signed)
I have sent him a supplement to take daily.

## 2020-01-12 ENCOUNTER — Ambulatory Visit: Payer: Medicare PPO | Attending: Internal Medicine

## 2020-01-12 DIAGNOSIS — Z23 Encounter for immunization: Secondary | ICD-10-CM | POA: Insufficient documentation

## 2020-01-12 NOTE — Progress Notes (Signed)
   Covid-19 Vaccination Clinic  Name:  Eddie Hutchinson    MRN: [id number] DOB: [birth year]  01/12/2020  Mr. Human was observed post Covid-19 immunization for 15 minutes without incident. He was provided with Vaccine Information Sheet and instruction to access the V-Safe system.   [name] was instructed to call 911 with any severe reactions post vaccine: Marland Kitchen Difficulty breathing  . Swelling of face and throat  . A fast heartbeat  . A bad rash all over body  . Dizziness and weakness   Immunizations Administered    Name Date Dose VIS Date Route   Pfizer COVID-19 Vaccine 01/12/2020  2:52 PM 0.3 mL 10/20/2019 Intramuscular   Manufacturer: [place]   Lot: [id number]   [place]: [id number]

## 2020-01-21 ENCOUNTER — Other Ambulatory Visit: Payer: Self-pay | Admitting: Nurse Practitioner

## 2020-02-07 ENCOUNTER — Ambulatory Visit: Payer: Medicare PPO | Attending: Internal Medicine

## 2020-02-07 DIAGNOSIS — Z23 Encounter for immunization: Secondary | ICD-10-CM

## 2020-02-07 NOTE — Progress Notes (Signed)
   Covid-19 Vaccination Clinic  Name:  CORIN TILLY    MRN: [id number] DOB: 07-13-1941  02/07/2020  Mr. Crock was observed post Covid-19 immunization for 15 minutes without incident. He was provided with Vaccine Information Sheet and instruction to access the V-Safe system.   Mr. Tallon was instructed to call 911 with any severe reactions post vaccine: Marland Kitchen Difficulty breathing  . Swelling of face and throat  . A fast heartbeat  . A bad rash all over body  . Dizziness and weakness   Immunizations Administered    Name Date Dose VIS Date Route   Pfizer COVID-19 Vaccine 02/07/2020  2:59 PM 0.3 mL 10/20/2019 Intramuscular   Manufacturer: [place]   Lot: [id number]   [place]: [address]-4

## 2020-02-17 ENCOUNTER — Other Ambulatory Visit: Payer: Self-pay

## 2020-02-17 ENCOUNTER — Ambulatory Visit (HOSPITAL_COMMUNITY)
Admission: EM | Admit: 2020-02-17 | Discharge: 2020-02-17 | Disposition: A | Discharge status: Home / Self Care | Payer: Medicare PPO | Attending: Family Medicine | Admitting: Family Medicine

## 2020-02-17 ENCOUNTER — Encounter (HOSPITAL_COMMUNITY): Payer: Self-pay

## 2020-02-17 DIAGNOSIS — S46912A Strain of unspecified muscle, fascia and tendon at shoulder and upper arm level, left arm, initial encounter: Secondary | ICD-10-CM

## 2020-02-17 DIAGNOSIS — I10 Essential (primary) hypertension: Secondary | ICD-10-CM

## 2020-02-17 MED ORDER — CYCLOBENZAPRINE HCL 5 MG PO TABS: 5.0000 mg | ORAL_TABLET | Freq: Every day | ORAL | 0 refills | Status: DC

## 2020-02-17 MED ORDER — PREDNISONE 20 MG PO TABS: 20.0000 mg | ORAL_TABLET | Freq: Every day | ORAL | 0 refills | Status: AC

## 2020-02-17 NOTE — ED Provider Notes (Signed)
[place]    CSN: [id number] Arrival date & time: 02/17/20  1029      History   Chief Complaint Chief Complaint  Patient presents with  . left arm    HPI Eddie Hutchinson is a 79 y.o. male.   HPI  Left lower shoulder and bicep pain x 1 month. He has tried multiple topical treatments without significant improvement of pain. Denies any known injury or heavy lifting.  His arm only aches when he flexes his bicep and abducts the left extremity away from his chest. He notices the pain mostly upon awakening in the morning and subsides throughout the day. He has purchased OTC topicals including Voltaren with intermittent relief of pain. Occasionally the pain radiates from the upper left arm to his elbow. He denies swelling or redness. Patient's blood pressure is elevated on arrival today. He reports that his nephrologist recently added hydralazine and he is suppose to monitor blood pressure daily. He denies chest pain , shortness of breath, headache, or dizziness. Past Medical History:  Diagnosis Date  . Abnormal EKG 11/24/2016   LAFB with LVH and QRS widening  . Benign essential HTN 11/24/2016  . Family history of early CAD 11/24/2016  . Glucose intolerance (impaired glucose tolerance)   . Hyperlipidemia   . Hyperlipidemia 11/24/2016  . Hypertension   . Kidney stone   . PVC's (premature ventricular contractions) 11/24/2016  . Vitamin D deficiency     Patient Active Problem List   Diagnosis Date Noted  . Stage 3b chronic kidney disease 11/15/2019  . Grief 05/08/2019  . Abnormal glucose 11/07/2018  . Benign essential HTN 11/24/2016  . Mixed hyperlipidemia 11/24/2016  . Family history of early CAD 11/24/2016  . Abnormal EKG 11/24/2016  . PVC's (premature ventricular contractions) 11/24/2016  . Vitamin D deficiency   . Glucose intolerance (impaired glucose tolerance)     Past Surgical History:  Procedure Laterality Date  . CYSTOSCOPY/RETROGRADE/URETEROSCOPY/STONE  EXTRACTION WITH BASKET    . KIDNEY STONE SURGERY    . LITHOTRIPSY      Home Medications    Prior to Admission medications   Medication Sig Start Date End Date Taking? Authorizing Provider  amLODipine (NORVASC) 10 MG tablet Take 5 mg by mouth daily.     [provider]  cyclobenzaprine (FLEXERIL) 5 MG tablet Take 1 tablet (5 mg total) by mouth at bedtime. 02/17/20   Scot Jun, FNP  Ergocalciferol (VITAMIN D2 PO) Take 1,000 Units by mouth daily.     [provider]  fenofibrate 160 MG tablet TAKE 1 TABLET BY MOUTH EVERY DAY 01/22/20   [name], FNP  ferrous sulfate 325 (65 FE) MG tablet Take 1 tablet (325 mg total) by mouth daily. 12/11/19 12/10/20  [name], FNP  predniSONE (DELTASONE) 20 MG tablet Take 1 tablet (20 mg total) by mouth daily with breakfast for 5 days. 02/17/20 02/22/20  Scot Jun, FNP    Family History Family History  Problem Relation Age of Onset  . Heart failure Mother   . Heart attack Father 95  . Heart disease Father   . CVA Brother   . Cancer Brother     Social History Social History   Tobacco Use  . Smoking status: Former Research scientist (life sciences)  . Smokeless tobacco: Never Used  Substance Use Topics  . Alcohol use: Yes    Comment: occassionally  . Drug use: No     Allergies   Bee venom   Review of  Systems Review of Systems Pertinent negatives listed in HPI Physical Exam Triage Vital Signs ED Triage Vitals  Enc Vitals Group     BP 02/17/20 1052 (!) 182/71     Pulse Rate 02/17/20 1052 89     Resp 02/17/20 1052 16     Temp 02/17/20 1052 98.4 F (36.9 C)     Temp Source 02/17/20 1052 Oral     SpO2 02/17/20 1052 97 %     Weight 02/17/20 1053 191 lb (86.6 kg)     Height 02/17/20 1053 $RemoveBefor'5\' 8"'SgyViNZyMgtG$  (1.727 m)     Head Circumference --      Peak Flow --      Pain Score 02/17/20 1053 8     Pain Loc --      Pain Edu? --      Excl. in [place]? --    No data found.  Updated Vital Signs BP (!) 182/71   Pulse 89   Temp 98.4 F  (36.9 C) (Oral)   Resp 16   Ht $R'5\' 8"'Nn$  (1.727 m)   Wt 191 lb (86.6 kg)   SpO2 97%   BMI 29.04 kg/m   Visual Acuity Right Eye Distance:   Left Eye Distance:   Bilateral Distance:    Right Eye Near:   Left Eye Near:    Bilateral Near:     Physical Exam Constitutional:      Appearance: Normal appearance. He is not ill-appearing or diaphoretic.  Eyes:     Extraocular Movements: Extraocular movements intact.     Pupils: Pupils are equal, round, and reactive to light.  Cardiovascular:     Rate and Rhythm: Normal rate and regular rhythm.  Pulmonary:     Effort: Pulmonary effort is normal.     Breath sounds: Normal breath sounds.  Musculoskeletal:        General: No swelling.       Arms:     Cervical back: Normal range of motion and neck supple.  Neurological:     General: No focal deficit present.     Mental Status: He is alert.     Coordination: Coordination normal.     Gait: Gait normal.  Psychiatric:        Mood and Affect: Mood normal.        Thought Content: Thought content normal.        Judgment: Judgment normal.      UC Treatments / Results  Labs (all labs ordered are listed, but only abnormal results are displayed) Labs Reviewed - No data to display  EKG   Radiology No results found.  Procedures Procedures (including critical care time)  Medications Ordered in UC Medications - No data to display  Initial Impression / Assessment and Plan / UC Course  I have reviewed the triage vital signs and the nursing notes.  Pertinent labs & imaging results that were available during my care of the patient were reviewed by me and considered in my medical decision making (see chart for details).    Muscle strain of left upper arm, initial encounter -Prednisone 20 mg once daily x5 days with breakfast.  Avoid any NSAID given patient has had prior labs indicating some CKD 3.  Cyclobenzaprine 5 mg as needed at bedtime for pain if needed.  Follow-up with PCP if  symptoms worsen and do not improve.  Elevated blood pressure reading with diagnosis of hypertension -Patient reported that her renal doctor recently added hydralazine 3 times a day  and he is currently due for his midday dose of hydralazine.  On recheck blood pressure is 177/74.  Encourage patient to take his hydralazine when he gets home and recheck blood pressure.  He is asymptomatic here in clinic today.  Advised if blood pressure remains greater than 160/90 to follow-up with his cardiologist in nephrologist on Monday for further instructions and evaluation as his medications may require titration. Final Clinical Impressions(s) / UC Diagnoses   Final diagnoses:  Muscle strain of left upper arm, initial encounter  Elevated blood pressure reading with diagnosis of hypertension     Discharge Instructions     Prednisone 20 mg with breakfast for 5 days. Tylenol 500 mg every 6 hours as needed for pain. Take Cyclobenzaprine at bedtime only.   ED Prescriptions    Medication Sig Dispense Auth. Provider   predniSONE (DELTASONE) 20 MG tablet Take 1 tablet (20 mg total) by mouth daily with breakfast for 5 days. 5 tablet Scot Jun, FNP   cyclobenzaprine (FLEXERIL) 5 MG tablet Take 1 tablet (5 mg total) by mouth at bedtime. 15 tablet Scot Jun, FNP     PDMP not reviewed this encounter.   Scot Jun, FNP 02/19/20 2209

## 2020-02-17 NOTE — ED Triage Notes (Addendum)
Pt c/o 8/10 sharp pain in left upper armx1 mo. PT states when he moves his arm a certain way he has pain. Pt has full ROM of left arm.

## 2020-02-17 NOTE — Discharge Instructions (Addendum)
Prednisone 20 mg with breakfast for 5 days. Tylenol 500 mg every 6 hours as needed for pain. Take Cyclobenzaprine at bedtime only.

## 2020-03-04 ENCOUNTER — Other Ambulatory Visit: Payer: Self-pay | Admitting: Nurse Practitioner

## 2020-04-14 ENCOUNTER — Other Ambulatory Visit: Payer: Self-pay | Admitting: Nurse Practitioner

## 2020-06-05 ENCOUNTER — Ambulatory Visit: Payer: Medicare PPO | Admitting: Nurse Practitioner

## 2020-06-05 ENCOUNTER — Encounter: Payer: Self-pay | Admitting: Nurse Practitioner

## 2020-06-05 ENCOUNTER — Ambulatory Visit: Payer: Medicare Other

## 2020-06-05 ENCOUNTER — Other Ambulatory Visit: Payer: Self-pay

## 2020-06-05 VITALS — BP 142/60 | HR 67 | Temp 98.5°F | Ht 68.8 in | Wt 190.8 lb

## 2020-06-05 DIAGNOSIS — E782 Mixed hyperlipidemia: Secondary | ICD-10-CM | POA: Diagnosis not present

## 2020-06-05 DIAGNOSIS — R0989 Other specified symptoms and signs involving the circulatory and respiratory systems: Secondary | ICD-10-CM

## 2020-06-05 DIAGNOSIS — I129 Hypertensive chronic kidney disease with stage 1 through stage 4 chronic kidney disease, or unspecified chronic kidney disease: Secondary | ICD-10-CM

## 2020-06-05 DIAGNOSIS — Z1159 Encounter for screening for other viral diseases: Secondary | ICD-10-CM

## 2020-06-05 DIAGNOSIS — R195 Other fecal abnormalities: Secondary | ICD-10-CM

## 2020-06-05 DIAGNOSIS — R7309 Other abnormal glucose: Secondary | ICD-10-CM | POA: Diagnosis not present

## 2020-06-05 DIAGNOSIS — N183 Chronic kidney disease, stage 3 unspecified: Secondary | ICD-10-CM

## 2020-06-05 MED ORDER — AMLODIPINE BESYLATE 10 MG PO TABS: 10.0000 mg | ORAL_TABLET | Freq: Every day | ORAL | 1 refills | Status: DC

## 2020-06-05 NOTE — Progress Notes (Signed)
This visit occurred during the SARS-CoV-2 public health emergency.  Safety protocols were in place, including screening questions prior to the visit, additional usage of staff PPE, and extensive cleaning of exam room while observing appropriate contact time as indicated for disinfecting solutions.  Subjective:     Patient ID: Eddie Hutchinson , male    DOB: October 18, 1941 , 79 y.o.   MRN: [id number]   Chief Complaint  Patient presents with   Hypertension    HPI  Presents today for his six month HTN follow up. He denies any headaches, CP, or SOB. He is checking his BP at home and is compliant with the regimen. He states that he is feeling some chills at times but is not tired. He was concerned about the color of his stool but was educated on the side effects of iron. I also encouraged him to take his iron with orange juice to help with binding.   He is interested in the hepatitis C vaccine today. He does walk and plays golf weekly. He is aware of hidden sodium in foods and abstains from salting his foods. He is drinkinf three bottles of water a day and was encouraged to increase his intake to at least six bottle a day.  Hypertension This is a chronic problem. The current episode started more than 1 year ago. The problem has been gradually improving since onset. The problem is controlled. Pertinent negatives include no blurred vision, chest pain, headaches, palpitations, peripheral edema or shortness of breath. There are no associated agents to hypertension. Risk factors for coronary artery disease include male gender and family history. Past treatments include alpha 1 blockers and calcium channel blockers. The current treatment provides mild improvement. There are no compliance problems.  Hypertensive end-organ damage includes CAD/MI. There is no history of CVA or left ventricular hypertrophy. Identifiable causes of hypertension include chronic renal disease (stage 2-3 kidney ).     Past Medical  History:  Diagnosis Date   Abnormal EKG 11/24/2016   LAFB with LVH and QRS widening   Benign essential HTN 11/24/2016   Family history of early CAD 11/24/2016   Glucose intolerance (impaired glucose tolerance)    Hyperlipidemia    Hyperlipidemia 11/24/2016   Hypertension    Kidney stone    PVC's (premature ventricular contractions) 11/24/2016   Vitamin D deficiency      Family History  Problem Relation Age of Onset   Heart failure Mother    Heart attack Father 3   Heart disease Father    CVA Brother    Cancer Brother      Current Outpatient Medications:    amLODipine (NORVASC) 10 MG tablet, Take 1 tablet (10 mg total) by mouth daily., Disp: 90 tablet, Rfl: 1   Ergocalciferol (VITAMIN D2 PO), Take 1,000 Units by mouth daily. , Disp: , Rfl:    fenofibrate 160 MG tablet, TAKE 1 TABLET BY MOUTH EVERY DAY, Disp: 90 tablet, Rfl: 0   ferrous sulfate 325 (65 FE) MG tablet, TAKE 1 TABLET BY MOUTH EVERY DAY, Disp: 90 tablet, Rfl: 1   hydrALAZINE (APRESOLINE) 100 MG tablet, Take 100 mg by mouth 3 (three) times daily., Disp: , Rfl:    Allergies  Allergen Reactions   Bee Venom Hives    As a child      Review of Systems  Constitutional: Negative.  Negative for fatigue.  Eyes: Negative for blurred vision.  Respiratory: Negative.  Negative for shortness of breath and wheezing.   Cardiovascular:  Negative for chest pain and palpitations.  Endocrine: Positive for cold intolerance.       Some chills at times  Genitourinary: Negative.   Musculoskeletal: Negative.   Neurological: Negative.  Negative for dizziness and headaches.  Psychiatric/Behavioral: Negative.      Today's Vitals   06/05/20 0934  BP: (!) 142/60  Pulse: 67  Temp: 98.5 F (36.9 C)  TempSrc: Oral  Weight: 190 lb 12.8 oz (86.5 kg)  Height: 5' 8.8" (1.748 m)  PainSc: 0-No pain   Body mass index is 28.34 kg/m.   Objective:  Physical Exam Constitutional:      General: He is not in acute  distress.    Appearance: Normal appearance. He is normal weight.  Neck:     Vascular: No carotid bruit.  Cardiovascular:     Rate and Rhythm: Normal rate and regular rhythm.     Pulses: Normal pulses.     Heart sounds: Normal heart sounds. No murmur heard.      Comments: Left carotid bruit Pulmonary:     Effort: Pulmonary effort is normal. No respiratory distress.     Breath sounds: Normal breath sounds. No wheezing.  Musculoskeletal:        General: Tenderness (left shoulder on palpation) present. No swelling.     Cervical back: Normal range of motion and neck supple.     Right lower leg: No edema.     Left lower leg: No edema.  Lymphadenopathy:     Cervical: No cervical adenopathy.  Skin:    General: Skin is warm and dry.     Capillary Refill: Capillary refill takes less than 2 seconds.  Neurological:     General: No focal deficit present.     Mental Status: He is alert and oriented to person, place, and time. Mental status is at baseline.     Cranial Nerves: No cranial nerve deficit.  Psychiatric:        Mood and Affect: Mood normal.        Behavior: Behavior normal.        Thought Content: Thought content normal.        Judgment: Judgment normal.         Assessment And Plan:     1. Benign hypertension with chronic kidney disease, stage III  B/P is controlled.   CMP ordered to check renal function.   The importance of regular exercise and dietary modification was stressed to the patient.  - CMP14+EGFR - amLODipine (NORVASC) 10 MG tablet; Take 1 tablet (10 mg total) by mouth daily.  Dispense: 90 tablet; Refill: 1  2. Mixed hyperlipidemia  Chronic, controlled  Continue with current medications - Lipid panel  3. Abnormal glucose  Chronic, stable  no current medications  Encouraged to limit intake of sugary foods and drinks  Continue with physical activity to 150 minutes per week  4. Dark stools  Will have him to come and get stool cards  He is  taking iron supplement - CBC with Differential/Platelet  5. Left carotid bruit  Bruit noted to left carotid   I will refer to cardiology for further evaluation as he reports he has been seen by cardiology in the past but is unsure of why he went  He has been having an elevated blood pressure over the last few months, better today - Ambulatory referral to Cardiology  6. Encounter for hepatitis C screening test for low risk patient  Will check Hepatitis C screening due to recent  recommendations to screen all adults 18 years and older - Hepatitis C antibody    Patient was given opportunity to ask questions. Patient verbalized understanding of the plan and was able to repeat key elements of the plan. All questions were answered to their satisfaction.  [name], FNP   I, [name], FNP, have reviewed all documentation for this visit. The documentation on 06/05/20 for the exam, diagnosis, procedures, and orders are all accurate and complete.   THE PATIENT IS ENCOURAGED TO PRACTICE SOCIAL DISTANCING DUE TO THE COVID-19 PANDEMIC.

## 2020-06-06 ENCOUNTER — Telehealth: Payer: Self-pay

## 2020-06-06 LAB — CBC WITH DIFFERENTIAL/PLATELET
Basophils Absolute: 0.1 10*3/uL (ref 0.0–0.2)
Basos: 1 %
EOS (ABSOLUTE): 0.3 10*3/uL (ref 0.0–0.4)
Eos: 4 %
Hematocrit: 31.9 % — ABNORMAL LOW (ref 37.5–51.0)
Hemoglobin: 10.4 g/dL — ABNORMAL LOW (ref 13.0–17.7)
Immature Grans (Abs): 0 10*3/uL (ref 0.0–0.1)
Immature Granulocytes: 0 %
Lymphocytes Absolute: 1.2 10*3/uL (ref 0.7–3.1)
Lymphs: 13 %
MCH: 28.2 pg (ref 26.6–33.0)
MCHC: 32.6 g/dL (ref 31.5–35.7)
MCV: 86 fL (ref 79–97)
Monocytes Absolute: 0.9 10*3/uL (ref 0.1–0.9)
Monocytes: 10 %
Neutrophils Absolute: 6.2 10*3/uL (ref 1.4–7.0)
Neutrophils: 72 %
Platelets: 498 10*3/uL — ABNORMAL HIGH (ref 150–450)
RBC: 3.69 x10E6/uL — ABNORMAL LOW (ref 4.14–5.80)
RDW: 15.5 % — ABNORMAL HIGH (ref 11.6–15.4)
WBC: 8.7 10*3/uL (ref 3.4–10.8)

## 2020-06-06 LAB — CMP14+EGFR
ALT: 16 IU/L (ref 0–44)
AST: 18 IU/L (ref 0–40)
Albumin/Globulin Ratio: 1.9 (ref 1.2–2.2)
Albumin: 4.1 g/dL (ref 3.7–4.7)
Alkaline Phosphatase: 37 IU/L — ABNORMAL LOW (ref 48–121)
BUN/Creatinine Ratio: 13 (ref 10–24)
BUN: 30 mg/dL — ABNORMAL HIGH (ref 8–27)
Bilirubin Total: 0.2 mg/dL (ref 0.0–1.2)
CO2: 21 mmol/L (ref 20–29)
Calcium: 9.8 mg/dL (ref 8.6–10.2)
Chloride: 109 mmol/L — ABNORMAL HIGH (ref 96–106)
Creatinine, Ser: 2.26 mg/dL — ABNORMAL HIGH (ref 0.76–1.27)
GFR calc Af Amer: 31 mL/min/{1.73_m2} — ABNORMAL LOW (ref >59)
GFR calc non Af Amer: 27 mL/min/{1.73_m2} — ABNORMAL LOW (ref >59)
Globulin, Total: 2.2 g/dL (ref 1.5–4.5)
Glucose: 91 mg/dL (ref 65–99)
Potassium: 5.2 mmol/L (ref 3.5–5.2)
Sodium: 144 mmol/L (ref 134–144)
Total Protein: 6.3 g/dL (ref 6.0–8.5)

## 2020-06-06 LAB — HEPATITIS C ANTIBODY: Hep C Virus Ab: <0.1 s/co ratio (ref 0.0–0.9)

## 2020-06-06 LAB — LIPID PANEL
Chol/HDL Ratio: 3 ratio (ref 0.0–5.0)
Cholesterol, Total: 119 mg/dL (ref 100–199)
HDL: 40 mg/dL (ref >39)
LDL Chol Calc (NIH): 64 mg/dL (ref 0–99)
Triglycerides: 75 mg/dL (ref 0–149)
VLDL Cholesterol Cal: 15 mg/dL (ref 5–40)

## 2020-06-06 NOTE — Progress Notes (Signed)
Send copy of his labs to Dr. Royce Macadamia, likely at [facility]

## 2020-06-06 NOTE — Telephone Encounter (Signed)
Labs sent to Dr. Royce Macadamia at [facility]  Per [facility] request

## 2020-07-08 ENCOUNTER — Other Ambulatory Visit: Payer: Self-pay | Admitting: Nurse Practitioner

## 2020-07-21 ENCOUNTER — Encounter: Payer: Self-pay | Admitting: Cardiovascular Disease

## 2020-07-21 NOTE — Progress Notes (Signed)
Cardiology Office Note:    Date:  07/22/2020   ID:  Eddie Hutchinson, DOB 1941/10/29, MRN [id number]  PCP:  [name], [facility] Cardiologist:  Stark Jock  [place] Electrophysiologist:  None   Referring MD: [name], FNP   Chief Complaint  Patient presents with  . Hyperlipidemia  carotid briut  Sept. 13, 2021   Eddie Hutchinson is a 79 y.o. male with a hx of HTN, HLD and a carotid bruit.   We are asked to see him by [name], FNP for further eval and management of hisleft  carotid bruit  He has been seen by Dr. Radford Pax I the past ( Jan. 2018)  Echo during that evaluation revealed a normal LV EF of 55-60%.  Has mild LAE Stress myoview showed no evidence of ischemia   Lipids from his primary MD were reviewed His LDL is 64.   HDL = 40,  total col = 119 Trigs = 75  No stroke or TIA symptoms.  No CP or dyspea. Fairly active ,   Does his yard work ,  Engineer, manufacturing systems occasionally  Does not walk , No arm or leg weakness. Has CKD.   Sees Dr. Royce Macadamia at [facility]    Past Medical History:  Diagnosis Date  . Abnormal EKG 11/24/2016   LAFB with LVH and QRS widening  . Benign essential HTN 11/24/2016  . Family history of early CAD 11/24/2016  . Glucose intolerance (impaired glucose tolerance)   . Hyperlipidemia   . Hyperlipidemia 11/24/2016  . Hypertension   . Kidney stone   . PVC's (premature ventricular contractions) 11/24/2016  . Vitamin D deficiency     Past Surgical History:  Procedure Laterality Date  . CYSTOSCOPY/RETROGRADE/URETEROSCOPY/STONE EXTRACTION WITH BASKET    . KIDNEY STONE SURGERY    . LITHOTRIPSY      Current Medications: Current Meds  Medication Sig  . amLODipine (NORVASC) 10 MG tablet Take 1 tablet (10 mg total) by mouth daily.  . Ergocalciferol (VITAMIN D2 PO) Take 1,000 Units by mouth daily.   . fenofibrate 160 MG tablet TAKE 1 TABLET BY MOUTH EVERY DAY  . ferrous sulfate 325 (65 FE) MG tablet TAKE 1 TABLET BY MOUTH EVERY  DAY  . hydrALAZINE (APRESOLINE) 100 MG tablet Take 100 mg by mouth 3 (three) times daily.  . isosorbide mononitrate (IMDUR) 30 MG 24 hr tablet Take 1 tablet by mouth daily.  . [DISCONTINUED] hydrALAZINE (APRESOLINE) 25 MG tablet Take 1 tablet by mouth daily.  . [DISCONTINUED] losartan (COZAAR) 25 MG tablet Take 1 tablet by mouth daily.     Allergies:   Bee venom   Social History   Socioeconomic History  . Marital status: Widowed    Spouse name: Not on file  . Number of children: Not on file  . Years of education: Not on file  . Highest education level: Not on file  Occupational History  . Occupation: retired  Tobacco Use  . Smoking status: Former Research scientist (life sciences)  . Smokeless tobacco: Never Used  Vaping Use  . Vaping Use: Never used  Substance and Sexual Activity  . Alcohol use: Yes    Comment: occassionally  . Drug use: No  . Sexual activity: Not Currently  Other Topics Concern  . Not on file  Social History Narrative  . Not on file   Social Determinants of Health   Financial Resource Strain: Low Risk   . Difficulty of Paying Living Expenses: Not hard at  all  Food Insecurity: No Food Insecurity  . Worried About Charity fundraiser in the Last Year: Never true  . Ran Out of Food in the Last Year: Never true  Transportation Needs: No Transportation Needs  . Lack of Transportation (Medical): No  . Lack of Transportation (Non-Medical): No  Physical Activity: Inactive  . Days of Exercise per Week: 0 days  . Minutes of Exercise per Session: 0 min  Stress: Stress Concern Present  . Feeling of Stress : To some extent  Social Connections:   . Frequency of Communication with Friends and Family: Not on file  . Frequency of Social Gatherings with Friends and Family: Not on file  . Attends Religious Services: Not on file  . Active Member of Clubs or Organizations: Not on file  . Attends Archivist Meetings: Not on file  . Marital Status: Not on file     Family  History: The patient's family history includes CVA in his brother; Cancer in his brother; Heart attack (age of onset: 86) in his father; Heart disease in his father; Heart failure in his mother.  ROS:   Please see the history of present illness.     All other systems reviewed and are negative.  EKGs/Labs/Other Studies Reviewed:    The following studies were reviewed today:   EKG:  EKG  From Jan, 7, 2021:  Sinus brady at 56.  No ST or T wave changes.   Recent Labs: 06/05/2020: ALT 16; BUN 30; Creatinine, Ser 2.26; Hemoglobin 10.4; Platelets 498; Potassium 5.2; Sodium 144  Recent Lipid Panel    Component Value Date/Time   CHOL 119 06/05/2020 1022   TRIG 75 06/05/2020 1022   HDL 40 06/05/2020 1022   CHOLHDL 3.0 06/05/2020 1022   LDLCALC 64 06/05/2020 1022    Physical Exam:    VS:  BP 134/62   Pulse 76   Ht $R'5\' 9"'hi$  (1.753 m)   Wt 189 lb 9.6 oz (86 kg)   SpO2 98%   BMI 28.00 kg/m     Wt Readings from Last 3 Encounters:  07/22/20 189 lb 9.6 oz (86 kg)  06/05/20 190 lb 12.8 oz (86.5 kg)  02/17/20 191 lb (86.6 kg)     GEN:  Well nourished, well developed in no acute distress HEENT: Normal NECK: No JVD; left carotid bruit LYMPHATICS: No lymphadenopathy CARDIAC:  RR with frequent premature beats.  Soft systolic murmur. RESPIRATORY:  Clear to auscultation without rales, wheezing or rhonchi  ABDOMEN: Soft, non-tender, non-distended MUSCULOSKELETAL:  No edema; No deformity  SKIN: Warm and dry NEUROLOGIC:  Alert and oriented x 3 PSYCHIATRIC:  Normal affect   ASSESSMENT:    1. Carotid bruit, unspecified laterality   2. Systolic murmur   3. Essential hypertension   4. Mixed hyperlipidemia   5. Bruit of left carotid artery    PLAN:    In order of problems listed above:  1.   Carotid bruit:  Has a soft left carotid bruit.   Will get a carotid duplex.  2.  Systolic murmur :   Will update his echo .  May be due to his previously seen aortic sclerosis   3.   Hyperlipidemia:   lipis are managed by his primary .  Well controlled. Labs reviewed  4.  HTN:   bp is well controlled.   Medication Adjustments/Labs and Tests Ordered: Current medicines are reviewed at length with the patient today.  Concerns regarding medicines are outlined above.  Orders Placed This Encounter  Procedures  . ECHOCARDIOGRAM COMPLETE  . VAS US CAROTID   No orders of the defined types were placed in this encounter.    Patient Instructions  Medication Instructions:  Your physician recommends that you continue on your current medications as directed. Please refer to the Current Medication list given to you today.  *If you need a refill on your cardiac medications before your next appointment, please call your pharmacy*   Lab Work: None  If you have labs (blood work) drawn today and your tests are completely normal, you will receive your results only by: Marland Kitchen MyChart Message (if you have MyChart) OR . A paper copy in the mail If you have any lab test that is abnormal or we need to change your treatment, we will call you to review the results.   Testing/Procedures: Your physician has requested that you have an echocardiogram. Echocardiography is a painless test that uses sound waves to create images of your heart. It provides your doctor with information about the size and shape of your heart and how well your heart's chambers and valves are working. This procedure takes approximately one hour. There are no restrictions for this procedure.  Your physician has requested that you have a carotid duplex. This test is an ultrasound of the carotid arteries in your neck. It looks at blood flow through these arteries that supply the brain with blood. Allow one hour for this exam. There are no restrictions or special instructions.   Follow-Up: At [facility], you and your health needs are our priority.  As part of our continuing mission to provide you with exceptional heart  care, we have created designated Provider Care Teams.  These Care Teams include your primary Cardiologist (physician) and Advanced Practice Providers (APPs -  Physician Assistants and Nurse Practitioners) who all work together to provide you with the care you need, when you need it.  We recommend signing up for the patient portal called "MyChart".  Sign up information is provided on this After Visit Summary.  MyChart is used to connect with patients for Virtual Visits (Telemedicine).  Patients are able to view lab/test results, encounter notes, upcoming appointments, etc.  Non-urgent messages can be sent to your provider as well.   To learn more about what you can do with MyChart, go to [email or web address].    Your next appointment:   12 month(s)  The format for your next appointment:   In Person  Provider:   You may see Mertie Moores, MD or one of the following Advanced Practice Providers on your designated Care Team:    Richardson Dopp, PA-C  Robbie Lis, [place]    Other Instructions None     Signed, Mertie Moores, MD  07/22/2020 5:53 PM    [place]

## 2020-07-22 ENCOUNTER — Ambulatory Visit: Payer: Medicare PPO | Admitting: Cardiovascular Disease

## 2020-07-22 ENCOUNTER — Encounter: Payer: Self-pay | Admitting: Cardiovascular Disease

## 2020-07-22 ENCOUNTER — Other Ambulatory Visit: Payer: Self-pay

## 2020-07-22 VITALS — BP 134/62 | HR 76 | Ht 69.0 in | Wt 189.6 lb

## 2020-07-22 DIAGNOSIS — R0989 Other specified symptoms and signs involving the circulatory and respiratory systems: Secondary | ICD-10-CM

## 2020-07-22 DIAGNOSIS — R011 Cardiac murmur, unspecified: Secondary | ICD-10-CM

## 2020-07-22 DIAGNOSIS — E782 Mixed hyperlipidemia: Secondary | ICD-10-CM

## 2020-07-22 DIAGNOSIS — I10 Essential (primary) hypertension: Secondary | ICD-10-CM

## 2020-07-22 NOTE — Patient Instructions (Signed)
Medication Instructions:  Your physician recommends that you continue on your current medications as directed. Please refer to the Current Medication list given to you today.  *If you need a refill on your cardiac medications before your next appointment, please call your pharmacy*   Lab Work: None  If you have labs (blood work) drawn today and your tests are completely normal, you will receive your results only by:  [place] (if you have MyChart) OR  A paper copy in the mail If you have any lab test that is abnormal or we need to change your treatment, we will call you to review the results.   Testing/Procedures: Your physician has requested that you have an echocardiogram. Echocardiography is a painless test that uses sound waves to create images of your heart. It provides your doctor with information about the size and shape of your heart and how well your hearts chambers and valves are working. This procedure takes approximately one hour. There are no restrictions for this procedure.  Your physician has requested that you have a carotid duplex. This test is an ultrasound of the carotid arteries in your neck. It looks at blood flow through these arteries that supply the brain with blood. Allow one hour for this exam. There are no restrictions or special instructions.   Follow-Up: At [facility], you and your health needs are our priority.  As part of our continuing mission to provide you with exceptional heart care, we have created designated Provider Care Teams.  These Care Teams include your primary Cardiologist (physician) and Advanced Practice Providers (APPs -  Physician Assistants and Nurse Practitioners) who all work together to provide you with the care you need, when you need it.  We recommend signing up for the patient portal called "MyChart".  Sign up information is provided on this After Visit Summary.  MyChart is used to connect with patients for Virtual Visits  (Telemedicine).  Patients are able to view lab/test results, encounter notes, upcoming appointments, etc.  Non-urgent messages can be sent to your provider as well.   To learn more about what you can do with MyChart, go to [email or web address].    Your next appointment:   12 month(s)  The format for your next appointment:   In Person  Provider:   You may see Mertie Moores, MD or one of the following Advanced Practice Providers on your designated Care Team:    Richardson Dopp, PA-C  Robbie Lis, [place]    Other Instructions None

## 2020-07-25 ENCOUNTER — Other Ambulatory Visit: Payer: Self-pay

## 2020-07-25 ENCOUNTER — Ambulatory Visit (HOSPITAL_COMMUNITY)
Admission: RE | Admit: 2020-07-25 | Discharge: 2020-07-25 | Disposition: A | Discharge status: Home / Self Care | Payer: Medicare PPO | Source: Ambulatory Visit | Attending: Internal Medicine | Admitting: Internal Medicine

## 2020-07-25 DIAGNOSIS — I10 Essential (primary) hypertension: Secondary | ICD-10-CM

## 2020-07-25 DIAGNOSIS — R0989 Other specified symptoms and signs involving the circulatory and respiratory systems: Secondary | ICD-10-CM

## 2020-07-25 DIAGNOSIS — R011 Cardiac murmur, unspecified: Secondary | ICD-10-CM

## 2020-08-08 ENCOUNTER — Ambulatory Visit (HOSPITAL_COMMUNITY): Payer: Medicare PPO | Attending: Cardiology

## 2020-08-08 ENCOUNTER — Other Ambulatory Visit: Payer: Self-pay

## 2020-08-08 DIAGNOSIS — I10 Essential (primary) hypertension: Secondary | ICD-10-CM

## 2020-08-08 DIAGNOSIS — R011 Cardiac murmur, unspecified: Secondary | ICD-10-CM | POA: Insufficient documentation

## 2020-08-08 LAB — ECHOCARDIOGRAM COMPLETE
Area-P 1/2: 2.95 cm2
S' Lateral: 3.2 cm

## 2020-08-19 ENCOUNTER — Other Ambulatory Visit: Payer: Self-pay | Admitting: Nurse Practitioner

## 2020-09-23 ENCOUNTER — Encounter: Payer: Self-pay | Admitting: Nurse Practitioner

## 2020-10-09 ENCOUNTER — Ambulatory Visit: Payer: Medicare PPO | Admitting: Nurse Practitioner

## 2020-10-21 ENCOUNTER — Encounter: Payer: Self-pay | Admitting: Nurse Practitioner

## 2020-10-21 ENCOUNTER — Other Ambulatory Visit: Payer: Self-pay

## 2020-10-21 ENCOUNTER — Ambulatory Visit: Payer: Medicare PPO | Admitting: Nurse Practitioner

## 2020-10-21 VITALS — BP 144/70 | HR 66 | Temp 98.6°F | Ht 68.2 in | Wt 188.6 lb

## 2020-10-21 DIAGNOSIS — N183 Chronic kidney disease, stage 3 unspecified: Secondary | ICD-10-CM

## 2020-10-21 DIAGNOSIS — E782 Mixed hyperlipidemia: Secondary | ICD-10-CM | POA: Diagnosis not present

## 2020-10-21 DIAGNOSIS — I129 Hypertensive chronic kidney disease with stage 1 through stage 4 chronic kidney disease, or unspecified chronic kidney disease: Secondary | ICD-10-CM | POA: Diagnosis not present

## 2020-10-21 DIAGNOSIS — R7309 Other abnormal glucose: Secondary | ICD-10-CM | POA: Diagnosis not present

## 2020-10-21 DIAGNOSIS — Z79899 Other long term (current) drug therapy: Secondary | ICD-10-CM

## 2020-10-21 NOTE — Patient Instructions (Signed)

## 2020-10-21 NOTE — Progress Notes (Signed)
I,Yamilka Roman [employer] as a Education administrator for [employer], FNP.,have documented all relevant documentation on the behalf of [name], FNP,as directed by  [name], FNP while in the presence of [name], [place]. This visit occurred during the SARS-CoV-2 public health emergency.  Safety protocols were in place, including screening questions prior to the visit, additional usage of staff PPE, and extensive cleaning of exam room while observing appropriate contact time as indicated for disinfecting solutions.  Subjective:     Patient ID: Eddie Hutchinson , male    DOB: Dec 22, 1940 , 79 y.o.   MRN: [id number]   Chief Complaint  Patient presents with  . Hypertension    HPI  Presents today for his six month HTN follow up. He denies any headaches, CP, or SOB. He is checking his BP at home and is compliant with the regimen.   Hypertension This is a chronic problem. The current episode started more than 1 year ago. The problem has been gradually improving since onset. The problem is controlled. Pertinent negatives include no blurred vision, chest pain, headaches, palpitations, peripheral edema or shortness of breath. There are no associated agents to hypertension. Risk factors for coronary artery disease include male gender and family history. Past treatments include alpha 1 blockers and calcium channel blockers. The current treatment provides mild improvement. There are no compliance problems.  Hypertensive end-organ damage includes CAD/MI. There is no history of CVA or left ventricular hypertrophy. Identifiable causes of hypertension include chronic renal disease (stage 2-3 kidney ).     Past Medical History:  Diagnosis Date  . Abnormal EKG 11/24/2016   LAFB with LVH and QRS widening  . Benign essential HTN 11/24/2016  . Family history of early CAD 11/24/2016  . Glucose intolerance (impaired glucose tolerance)   . Hyperlipidemia   . Hyperlipidemia 11/24/2016  . Hypertension   . Kidney stone    . PVC's (premature ventricular contractions) 11/24/2016  . Vitamin D deficiency      Family History  Problem Relation Age of Onset  . Heart failure Mother   . Heart attack Father 55  . Heart disease Father   . CVA Brother   . Cancer Brother      Current Outpatient Medications:  .  amLODipine (NORVASC) 10 MG tablet, Take 1 tablet (10 mg total) by mouth daily., Disp: 90 tablet, Rfl: 1 .  Ergocalciferol (VITAMIN D2 PO), Take 1,000 Units by mouth daily. , Disp: , Rfl:  .  fenofibrate 160 MG tablet, TAKE 1 TABLET BY MOUTH EVERY DAY, Disp: 90 tablet, Rfl: 0 .  ferrous sulfate 325 (65 FE) MG tablet, TAKE 1 TABLET BY MOUTH EVERY DAY, Disp: 90 tablet, Rfl: 1 .  hydrALAZINE (APRESOLINE) 100 MG tablet, Take 100 mg by mouth 3 (three) times daily., Disp: , Rfl:  .  isosorbide mononitrate (IMDUR) 30 MG 24 hr tablet, Take 1 tablet by mouth daily., Disp: , Rfl:    Allergies  Allergen Reactions  . Bee Venom Hives    As a child      Review of Systems  Constitutional: Negative.  Negative for fatigue.  HENT: Negative.   Eyes: Negative.  Negative for blurred vision.  Respiratory: Negative.  Negative for shortness of breath.   Cardiovascular: Negative.  Negative for chest pain, palpitations and leg swelling.  Gastrointestinal: Negative.   Endocrine: Negative.   Genitourinary: Negative.   Musculoskeletal: Negative.   Skin: Negative.   Neurological: Negative.  Negative for headaches.  Hematological: Negative.  Psychiatric/Behavioral: Negative.      Today's Vitals   10/21/20 1024  BP: (!) 144/70  Pulse: 66  Temp: 98.6 F (37 C)  TempSrc: Oral  Weight: 188 lb 9.6 oz (85.5 kg)  Height: 5' 8.2" (1.732 m)  PainSc: 0-No pain   Body mass index is 28.51 kg/m.   Objective:  Physical Exam Vitals reviewed.  Constitutional:      General: He is not in acute distress.    Appearance: Normal appearance.  Cardiovascular:     Rate and Rhythm: Normal rate and regular rhythm.     Pulses:  Normal pulses.     Heart sounds: Normal heart sounds. No murmur heard.   Pulmonary:     Effort: Pulmonary effort is normal. No respiratory distress.     Breath sounds: Normal breath sounds.  Neurological:     General: No focal deficit present.     Mental Status: He is alert and oriented to person, place, and time.     Cranial Nerves: No cranial nerve deficit.  Psychiatric:        Mood and Affect: Mood normal.        Behavior: Behavior normal.        Thought Content: Thought content normal.        Judgment: Judgment normal.         Assessment And Plan:     1. Benign hypertension with chronic kidney disease, stage III (HCC)  Chronic, fair control  Continue with current medications  Continue to avoid high salt foods - CMP14+EGFR - CBC  2. Abnormal glucose  Chronic, stable  No current medications  Encouraged to limit intake of sugary foods and drinks  Encouraged to increase physical activity to 150 minutes per week - Hemoglobin A1c  3. Mixed hyperlipidemia  Chronic, well controlled within normal limits  Continue with current medications, tolerating medications well - Lipid panel  4. Other long term (current) drug therapy - CBC     Patient was given opportunity to ask questions. Patient verbalized understanding of the plan and was able to repeat key elements of the plan. All questions were answered to their satisfaction.   Teola Bradley, FNP, have reviewed all documentation for this visit. The documentation on 10/27/20 for the exam, diagnosis, procedures, and orders are all accurate and complete.   THE PATIENT IS ENCOURAGED TO PRACTICE SOCIAL DISTANCING DUE TO THE COVID-19 PANDEMIC.

## 2020-10-22 LAB — CMP14+EGFR
ALT: 13 IU/L (ref 0–44)
AST: 18 IU/L (ref 0–40)
Albumin/Globulin Ratio: 1.7 (ref 1.2–2.2)
Albumin: 4.1 g/dL (ref 3.7–4.7)
Alkaline Phosphatase: 40 IU/L — ABNORMAL LOW (ref 44–121)
BUN/Creatinine Ratio: 10 (ref 10–24)
BUN: 17 mg/dL (ref 8–27)
Bilirubin Total: 0.2 mg/dL (ref 0.0–1.2)
CO2: 22 mmol/L (ref 20–29)
Calcium: 9.4 mg/dL (ref 8.6–10.2)
Chloride: 109 mmol/L — ABNORMAL HIGH (ref 96–106)
Creatinine, Ser: 1.71 mg/dL — ABNORMAL HIGH (ref 0.76–1.27)
GFR calc Af Amer: 43 mL/min/{1.73_m2} — ABNORMAL LOW (ref >59)
GFR calc non Af Amer: 38 mL/min/{1.73_m2} — ABNORMAL LOW (ref >59)
Globulin, Total: 2.4 g/dL (ref 1.5–4.5)
Glucose: 98 mg/dL (ref 65–99)
Potassium: 4.6 mmol/L (ref 3.5–5.2)
Sodium: 142 mmol/L (ref 134–144)
Total Protein: 6.5 g/dL (ref 6.0–8.5)

## 2020-10-22 LAB — LIPID PANEL
Chol/HDL Ratio: 2.8 ratio (ref 0.0–5.0)
Cholesterol, Total: 130 mg/dL (ref 100–199)
HDL: 47 mg/dL (ref >39)
LDL Chol Calc (NIH): 68 mg/dL (ref 0–99)
Triglycerides: 76 mg/dL (ref 0–149)
VLDL Cholesterol Cal: 15 mg/dL (ref 5–40)

## 2020-10-22 LAB — CBC
Hematocrit: 32.4 % — ABNORMAL LOW (ref 37.5–51.0)
Hemoglobin: 10.6 g/dL — ABNORMAL LOW (ref 13.0–17.7)
MCH: 27.5 pg (ref 26.6–33.0)
MCHC: 32.7 g/dL (ref 31.5–35.7)
MCV: 84 fL (ref 79–97)
Platelets: 542 10*3/uL — ABNORMAL HIGH (ref 150–450)
RBC: 3.86 x10E6/uL — ABNORMAL LOW (ref 4.14–5.80)
RDW: 16.6 % — ABNORMAL HIGH (ref 11.6–15.4)
WBC: 7.9 10*3/uL (ref 3.4–10.8)

## 2020-10-22 LAB — HEMOGLOBIN A1C
Est. average glucose Bld gHb Est-mCnc: 111 mg/dL
Hgb A1c MFr Bld: 5.5 % (ref 4.8–5.6)

## 2020-10-29 DIAGNOSIS — N1832 Chronic kidney disease, stage 3b: Secondary | ICD-10-CM | POA: Diagnosis not present

## 2020-11-06 DIAGNOSIS — N1832 Chronic kidney disease, stage 3b: Secondary | ICD-10-CM | POA: Diagnosis not present

## 2020-11-06 DIAGNOSIS — R809 Proteinuria, unspecified: Secondary | ICD-10-CM | POA: Diagnosis not present

## 2020-11-06 DIAGNOSIS — Z87442 Personal history of urinary calculi: Secondary | ICD-10-CM | POA: Diagnosis not present

## 2020-11-06 DIAGNOSIS — E1122 Type 2 diabetes mellitus with diabetic chronic kidney disease: Secondary | ICD-10-CM | POA: Diagnosis not present

## 2020-11-06 DIAGNOSIS — I129 Hypertensive chronic kidney disease with stage 1 through stage 4 chronic kidney disease, or unspecified chronic kidney disease: Secondary | ICD-10-CM | POA: Diagnosis not present

## 2020-11-06 DIAGNOSIS — N400 Enlarged prostate without lower urinary tract symptoms: Secondary | ICD-10-CM | POA: Diagnosis not present

## 2020-11-06 DIAGNOSIS — E875 Hyperkalemia: Secondary | ICD-10-CM | POA: Diagnosis not present

## 2020-11-06 DIAGNOSIS — R7303 Prediabetes: Secondary | ICD-10-CM | POA: Diagnosis not present

## 2020-11-06 DIAGNOSIS — D631 Anemia in chronic kidney disease: Secondary | ICD-10-CM | POA: Diagnosis not present

## 2020-11-13 ENCOUNTER — Encounter: Payer: Medicare PPO | Admitting: Nurse Practitioner

## 2020-11-18 DIAGNOSIS — E875 Hyperkalemia: Secondary | ICD-10-CM | POA: Diagnosis not present

## 2020-11-19 ENCOUNTER — Encounter: Payer: Medicare PPO | Admitting: Nurse Practitioner

## 2020-12-03 DIAGNOSIS — N1832 Chronic kidney disease, stage 3b: Secondary | ICD-10-CM | POA: Diagnosis not present

## 2020-12-10 ENCOUNTER — Other Ambulatory Visit: Payer: Self-pay | Admitting: Nurse Practitioner

## 2020-12-10 DIAGNOSIS — I129 Hypertensive chronic kidney disease with stage 1 through stage 4 chronic kidney disease, or unspecified chronic kidney disease: Secondary | ICD-10-CM

## 2020-12-10 DIAGNOSIS — N183 Chronic kidney disease, stage 3 unspecified: Secondary | ICD-10-CM

## 2020-12-16 ENCOUNTER — Other Ambulatory Visit: Payer: Self-pay | Admitting: Nurse Practitioner

## 2021-01-07 HISTORY — PX: TOOTH EXTRACTION: SUR596

## 2021-02-15 ENCOUNTER — Other Ambulatory Visit: Payer: Self-pay | Admitting: Nurse Practitioner

## 2021-02-20 ENCOUNTER — Telehealth: Payer: Self-pay

## 2021-02-20 ENCOUNTER — Ambulatory Visit: Payer: Medicare PPO | Admitting: Nurse Practitioner

## 2021-02-20 ENCOUNTER — Ambulatory Visit (INDEPENDENT_AMBULATORY_CARE_PROVIDER_SITE_OTHER): Payer: Medicare PPO

## 2021-02-20 ENCOUNTER — Encounter: Payer: Self-pay | Admitting: Nurse Practitioner

## 2021-02-20 ENCOUNTER — Other Ambulatory Visit: Payer: Self-pay

## 2021-02-20 VITALS — BP 142/60 | HR 72 | Temp 98.1°F | Ht 68.0 in | Wt 185.2 lb

## 2021-02-20 DIAGNOSIS — E782 Mixed hyperlipidemia: Secondary | ICD-10-CM | POA: Diagnosis not present

## 2021-02-20 DIAGNOSIS — R7309 Other abnormal glucose: Secondary | ICD-10-CM | POA: Diagnosis not present

## 2021-02-20 DIAGNOSIS — Z0000 Encounter for general adult medical examination without abnormal findings: Secondary | ICD-10-CM

## 2021-02-20 DIAGNOSIS — I129 Hypertensive chronic kidney disease with stage 1 through stage 4 chronic kidney disease, or unspecified chronic kidney disease: Secondary | ICD-10-CM

## 2021-02-20 DIAGNOSIS — I10 Essential (primary) hypertension: Secondary | ICD-10-CM | POA: Diagnosis not present

## 2021-02-20 DIAGNOSIS — N1832 Chronic kidney disease, stage 3b: Secondary | ICD-10-CM | POA: Diagnosis not present

## 2021-02-20 DIAGNOSIS — E559 Vitamin D deficiency, unspecified: Secondary | ICD-10-CM

## 2021-02-20 DIAGNOSIS — N183 Chronic kidney disease, stage 3 unspecified: Secondary | ICD-10-CM | POA: Diagnosis not present

## 2021-02-20 LAB — POCT UA - MICROALBUMIN
Creatinine, POC: 300 mg/dL
Microalbumin Ur, POC: 150 mg/L

## 2021-02-20 LAB — POCT URINALYSIS DIPSTICK
Bilirubin, UA: NEGATIVE
Blood, UA: NEGATIVE
Glucose, UA: NEGATIVE
Ketones, UA: NEGATIVE
Leukocytes, UA: NEGATIVE
Nitrite, UA: NEGATIVE
Protein, UA: POSITIVE — AB
Spec Grav, UA: 1.015 (ref 1.010–1.025)
Urobilinogen, UA: 0.2 E.U./dL
pH, UA: 5.5 (ref 5.0–8.0)

## 2021-02-20 NOTE — Progress Notes (Signed)
This visit occurred during the SARS-CoV-2 public health emergency.  Safety protocols were in place, including screening questions prior to the visit, additional usage of staff PPE, and extensive cleaning of exam room while observing appropriate contact time as indicated for disinfecting solutions.  Subjective:   Eddie Hutchinson is a 80 y.o. male who presents for Medicare Annual/Subsequent preventive examination.  Review of Systems     Cardiac Risk Factors include: advanced age (>77men, >25 women);dyslipidemia;hypertension;male gender;sedentary lifestyle     Objective:    Today's Vitals   02/20/21 0959  BP: (!) 142/60  Pulse: 72  Temp: 98.1 F (36.7 C)  TempSrc: Oral  SpO2: 98%  Weight: 185 lb 3.2 oz (84 kg)  Height: $Remove'5\' 8"'GULsnZp$  (1.727 m)   Body mass index is 28.16 kg/m.  Advanced Directives 02/20/2021 02/17/2020 11/15/2019 05/30/2019 11/07/2018 11/07/2018  Does Patient Have a Medical Advance Directive? No No No No - No  Would patient like information on creating a medical advance directive? No - Patient declined No - Patient declined No - Patient declined No - Patient declined Yes (MAU/Ambulatory/Procedural Areas - Information given) Yes (MAU/Ambulatory/Procedural Areas - Information given)    Current Medications (verified) Outpatient Encounter Medications as of 02/20/2021  Medication Sig  . amLODipine (NORVASC) 10 MG tablet TAKE 1 TABLET BY MOUTH EVERY DAY  . Ergocalciferol (VITAMIN D2 PO) Take 1,000 Units by mouth daily.   . fenofibrate 160 MG tablet TAKE 1 TABLET BY MOUTH EVERY DAY  . ferrous sulfate 325 (65 FE) MG tablet TAKE 1 TABLET BY MOUTH EVERY DAY  . hydrALAZINE (APRESOLINE) 100 MG tablet Take 100 mg by mouth 3 (three) times daily.  . isosorbide mononitrate (IMDUR) 30 MG 24 hr tablet Take 1 tablet by mouth daily.   No facility-administered encounter medications on file as of 02/20/2021.    Allergies (verified) Bee venom   History: Past Medical History:  Diagnosis Date   . Abnormal EKG 11/24/2016   LAFB with LVH and QRS widening  . Benign essential HTN 11/24/2016  . Family history of early CAD 11/24/2016  . Glucose intolerance (impaired glucose tolerance)   . Hyperlipidemia   . Hyperlipidemia 11/24/2016  . Hypertension   . Kidney stone   . PVC's (premature ventricular contractions) 11/24/2016  . Vitamin D deficiency    Past Surgical History:  Procedure Laterality Date  . CYSTOSCOPY/RETROGRADE/URETEROSCOPY/STONE EXTRACTION WITH BASKET    . KIDNEY STONE SURGERY    . LITHOTRIPSY    . TOOTH EXTRACTION Left 01/2021   Family History  Problem Relation Age of Onset  . Heart failure Mother   . Heart attack Father 36  . Heart disease Father   . CVA Brother   . Cancer Brother    Social History   Socioeconomic History  . Marital status: Widowed    Spouse name: Not on file  . Number of children: Not on file  . Years of education: Not on file  . Highest education level: Not on file  Occupational History  . Occupation: retired  Tobacco Use  . Smoking status: Former Research scientist (life sciences)  . Smokeless tobacco: Never Used  Vaping Use  . Vaping Use: Never used  Substance and Sexual Activity  . Alcohol use: Yes    Comment: occassionally  . Drug use: No  . Sexual activity: Not Currently  Other Topics Concern  . Not on file  Social History Narrative  . Not on file   Social Determinants of Health   Financial Resource Strain: Low Risk   .  Difficulty of Paying Living Expenses: Not hard at all  Food Insecurity: No Food Insecurity  . Worried About Charity fundraiser in the Last Year: Never true  . Ran Out of Food in the Last Year: Never true  Transportation Needs: No Transportation Needs  . Lack of Transportation (Medical): No  . Lack of Transportation (Non-Medical): No  Physical Activity: Inactive  . Days of Exercise per Week: 0 days  . Minutes of Exercise per Session: 0 min  Stress: No Stress Concern Present  . Feeling of Stress : Not at all  Social  Connections: Not on file    Tobacco Counseling Counseling given: Not Answered   Clinical Intake:  Pre-visit preparation completed: Yes  Pain : No/denies pain     Nutritional Status: BMI 25 -29 Overweight Nutritional Risks: None Diabetes: No  How often do you need to have someone help you when you read instructions, pamphlets, or other written materials from your doctor or pharmacy?: 1 - Never What is the last grade level you completed in school?: 12th grade  Diabetic? no  Interpreter Needed?: No  Information entered by :: NAllen LPN   Activities of Daily Living In your present state of health, do you have any difficulty performing the following activities: 02/20/2021  Hearing? N  Vision? N  Difficulty concentrating or making decisions? N  Walking or climbing stairs? N  Dressing or bathing? N  Doing errands, shopping? N  Preparing Food and eating ? N  Using the Toilet? N  In the past six months, have you accidently leaked urine? N  Do you have problems with loss of bowel control? N  Managing your Medications? N  Managing your Finances? N  Housekeeping or managing your Housekeeping? N  Some recent data might be hidden    Patient Care Team: [name], FNP as PCP - General (General Practice)  Indicate any recent Medical Services you may have received from other than [facility] providers in the past year (date may be approximate).     Assessment:   This is a routine wellness examination for Eddie Hutchinson.  Hearing/Vision screen  Hearing Screening   '125Hz'$  $Remo'250Hz'OAmSF$'500Hz'$'1000Hz'$'2000Hz'$'3000Hz'$'4000Hz'$'6000Hz'$'8000Hz'$   Right ear:           Left ear:           Vision Screening Comments: No regular eye exams,   Dietary issues and exercise activities discussed: Current Exercise Habits: The patient does not participate in regular exercise at present  Goals    . Exercise 150 min/wk Moderate Activity     Would like to exercise more.      . Exercise 150 min/wk Moderate Activity      05/30/2019, patient wants to start exercising    . Patient Stated     11/15/2019, wants to stay active    . Patient Stated     02/20/2021, no goals      Depression Screen PHQ 2/9 Scores 02/20/2021 11/15/2019 08/09/2019 05/30/2019 05/08/2019 11/07/2018  PHQ - 2 Score 0 $Remov'2 1 2 'HXELAU$ 0 0  PHQ- 9 Score - 2 - 2 - -    Fall Risk Fall Risk  02/20/2021 11/15/2019 08/09/2019 05/30/2019 05/08/2019  Falls in the past year? 0 0 0 0 0  Number falls in past yr: - - - 0 -  Risk for fall due to : Medication side effect Medication side effect - Medication side effect -  Follow up Falls evaluation completed;Education provided;Falls prevention discussed Education  provided;Falls evaluation completed;Falls prevention discussed - Falls evaluation completed;Education provided;Falls prevention discussed -    FALL RISK PREVENTION PERTAINING TO THE HOME:  Any stairs in or around the home? Yes  If so, are there any without handrails? No  Home free of loose throw rugs in walkways, pet beds, electrical cords, etc? Yes  Adequate lighting in your home to reduce risk of falls? Yes   ASSISTIVE DEVICES UTILIZED TO PREVENT FALLS:  Life alert? No  Use of a cane, walker or w/c? No  Grab bars in the bathroom? Yes  Shower chair or bench in shower? No  Elevated toilet seat or a handicapped toilet? No   TIMED UP AND GO:  Was the test performed? No . .   Gait steady and fast without use of assistive device  Cognitive Function:     6CIT Screen 02/20/2021 11/15/2019 05/30/2019  What Year? 0 points 0 points 0 points  What month? 0 points 0 points 0 points  What time? 0 points 0 points 3 points  Count back from 20 0 points 0 points 0 points  Months in reverse 0 points 0 points 0 points  Repeat phrase 2 points 0 points 0 points  Total Score 2 0 3    Immunizations Immunization History  Administered Date(s) Administered  . Influenza, High Dose Seasonal PF 07/03/2019  . Influenza-Unspecified 10/08/2018, 09/18/2020  .  PFIZER(Purple Top)SARS-COV-2 Vaccination 01/12/2020, 02/07/2020, 10/22/2020  . Pneumococcal Conjugate-13 06/01/2019  . Pneumococcal-Unspecified 07/14/2012  . Tdap 05/30/2010, 05/18/2019    TDAP status: Up to date  Flu Vaccine status: Up to date  Pneumococcal vaccine status: Up to date  Covid-19 vaccine status: Completed vaccines  Qualifies for Shingles Vaccine? Yes   Zostavax completed Yes   Shingrix Completed?: No.    Education has been provided regarding the importance of this vaccine. Patient has been advised to call insurance company to determine out of pocket expense if they have not yet received this vaccine. Advised may also receive vaccine at local pharmacy or Health Dept. Verbalized acceptance and understanding.  Screening Tests Health Maintenance  Topic Date Due  . INFLUENZA VACCINE  06/09/2021  . TETANUS/TDAP  05/17/2029  . COVID-19 Vaccine  Completed  . Hepatitis C Screening  Completed  . PNA vac Low Risk Adult  Completed  . HPV VACCINES  Aged Out    Health Maintenance  There are no preventive care reminders to display for this patient.  Colorectal cancer screening: No longer required.   Lung Cancer Screening: (Low Dose CT Chest recommended if Age 7-80 years, 30 pack-year currently smoking OR have quit w/in 15years.) does not qualify.   Lung Cancer Screening Referral: no  Additional Screening:  Hepatitis C Screening: does qualify; Completed 06/05/2020  Vision Screening: Recommended annual ophthalmology exams for early detection of glaucoma and other disorders of the eye. Is the patient up to date with their annual eye exam?  No  Who is the provider or what is the name of the office in which the patient attends annual eye exams? none If pt is not established with a provider, would they like to be referred to a provider to establish care? No .   Dental Screening: Recommended annual dental exams for proper oral hygiene  Community Resource Referral / Chronic  Care Management: CRR required this visit?  No   CCM required this visit?  No      Plan:     I have personally reviewed and noted the following in the  patient's chart:   . Medical and social history . Use of alcohol, tobacco or illicit drugs  . Current medications and supplements . Functional ability and status . Nutritional status . Physical activity . Advanced directives . List of other physicians . Hospitalizations, surgeries, and ER visits in previous 12 months . Vitals . Screenings to include cognitive, depression, and falls . Referrals and appointments  In addition, I have reviewed and discussed with patient certain preventive protocols, quality metrics, and best practice recommendations. A written personalized care plan for preventive services as well as general preventive health recommendations were provided to patient.     Kellie Simmering, LPN   12/23/863   Nurse Notes:

## 2021-02-20 NOTE — Patient Instructions (Addendum)

## 2021-02-20 NOTE — Progress Notes (Signed)
I,Yamilka Roman [employer] as a Education administrator for [employer], FNP.,have documented all relevant documentation on the behalf of [name], FNP,as directed by  [name], FNP while in the presence of [name], [place]. This visit occurred during the SARS-CoV-2 public health emergency.  Safety protocols were in place, including screening questions prior to the visit, additional usage of staff PPE, and extensive cleaning of exam room while observing appropriate contact time as indicated for disinfecting solutions.  Subjective:     Patient ID: Eddie Hutchinson , male    DOB: August 08, 1941 , 80 y.o.   MRN: [id number]   Chief Complaint  Patient presents with  . Hypertension    HPI  Presents today for his six month HTN follow up. He denies any headaches, CP, or SOB. He is checking his BP at home and is compliant with the regimen.  He had a tooth pulled couple weeks ago and he continues to have soreness to his left upper jaw area.   Hypertension This is a chronic problem. The current episode started more than 1 year ago. The problem has been gradually improving since onset. The problem is controlled. Pertinent negatives include no blurred vision, chest pain, headaches, palpitations, peripheral edema or shortness of breath. There are no associated agents to hypertension. Risk factors for coronary artery disease include male gender and family history. Past treatments include alpha 1 blockers and calcium channel blockers. The current treatment provides mild improvement. There are no compliance problems.  Hypertensive end-organ damage includes CAD/MI. There is no history of CVA or left ventricular hypertrophy. Identifiable causes of hypertension include chronic renal disease (stage 2-3 kidney ).     Past Medical History:  Diagnosis Date  . Abnormal EKG 11/24/2016   LAFB with LVH and QRS widening  . Benign essential HTN 11/24/2016  . Family history of early CAD 11/24/2016  . Glucose intolerance (impaired  glucose tolerance)   . Hyperlipidemia   . Hyperlipidemia 11/24/2016  . Hypertension   . Kidney stone   . PVC's (premature ventricular contractions) 11/24/2016  . Vitamin D deficiency      Family History  Problem Relation Age of Onset  . Heart failure Mother   . Heart attack Father 75  . Heart disease Father   . CVA Brother   . Cancer Brother      Current Outpatient Medications:  .  amLODipine (NORVASC) 10 MG tablet, TAKE 1 TABLET BY MOUTH EVERY DAY, Disp: 90 tablet, Rfl: 1 .  Ergocalciferol (VITAMIN D2 PO), Take 1,000 Units by mouth daily. , Disp: , Rfl:  .  fenofibrate 160 MG tablet, TAKE 1 TABLET BY MOUTH EVERY DAY, Disp: 90 tablet, Rfl: 0 .  ferrous sulfate 325 (65 FE) MG tablet, TAKE 1 TABLET BY MOUTH EVERY DAY, Disp: 90 tablet, Rfl: 1 .  hydrALAZINE (APRESOLINE) 100 MG tablet, Take 100 mg by mouth 3 (three) times daily., Disp: , Rfl:  .  isosorbide mononitrate (IMDUR) 30 MG 24 hr tablet, Take 1 tablet by mouth daily., Disp: , Rfl:    Allergies  Allergen Reactions  . Bee Venom Hives    As a child      Review of Systems  Constitutional: Negative.   Eyes: Negative for blurred vision.  Respiratory: Negative for apnea and shortness of breath.   Cardiovascular: Negative for chest pain, palpitations and leg swelling.  Neurological: Negative for dizziness and headaches.  Psychiatric/Behavioral: Negative.      Today's Vitals   02/20/21 1022  BP: Marland Kitchen)  142/60  Pulse: 72  Temp: 98.1 F (36.7 C)  TempSrc: Oral  Weight: 185 lb 3 oz (84 kg)  Height: 5' 8"  (1.727 m)   Body mass index is 28.16 kg/m.   Objective:  Physical Exam Constitutional:      General: He is not in acute distress.    Appearance: Normal appearance.  Cardiovascular:     Rate and Rhythm: Normal rate and regular rhythm.     Pulses: Normal pulses.     Heart sounds: Normal heart sounds. No murmur heard.   Pulmonary:     Effort: Pulmonary effort is normal. No respiratory distress.     Breath sounds:  Normal breath sounds. No wheezing.  Skin:    Capillary Refill: Capillary refill takes less than 2 seconds.  Neurological:     General: No focal deficit present.     Mental Status: He is alert and oriented to person, place, and time.     Cranial Nerves: No cranial nerve deficit.     Motor: No weakness.  Psychiatric:        Mood and Affect: Mood normal.        Behavior: Behavior normal.        Thought Content: Thought content normal.        Judgment: Judgment normal.         Assessment And Plan:     1. Benign hypertension with chronic kidney disease, stage III (HCC)  Chronic, fairly controlled  Continue with current medications  Blood pressure is improving - CMP14+EGFR - Hemoglobin A1c  2. Abnormal glucose  Chronic, controlled  No current medications  Encouraged to limit intake of sugary foods and drinks  Encouraged to increase physical activity to 150 minutes per week as tolerated  3. Vitamin D deficiency  Will check vitamin D level and supplement as needed.     Also encouraged to spend 15 minutes in the sun daily.   4. Mixed hyperlipidemia  Chronic, controlled  Continue with current medications, tolerating fenofibrate well - Lipid panel   He was seen by Dr Royce Macadamia (Nephrology), next appt in June  Patient was given opportunity to ask questions. Patient verbalized understanding of the plan and was able to repeat key elements of the plan. All questions were answered to their satisfaction.  [name], FNP    I, [name], FNP, have reviewed all documentation for this visit. The documentation on 02/20/21 for the exam, diagnosis, procedures, and orders are all accurate and complete.  IF YOU HAVE BEEN REFERRED TO A SPECIALIST, IT MAY TAKE 1-2 WEEKS TO SCHEDULE/PROCESS THE REFERRAL. IF YOU HAVE NOT HEARD FROM US/SPECIALIST IN TWO WEEKS, PLEASE GIVE [place] A CALL AT [phone].   THE PATIENT IS ENCOURAGED TO PRACTICE SOCIAL DISTANCING DUE TO THE COVID-19  PANDEMIC.

## 2021-02-20 NOTE — Telephone Encounter (Signed)
error 

## 2021-02-20 NOTE — Addendum Note (Signed)
Addended by: Glenna Durand E on: 02/20/2021 01:54 PM   Modules accepted: Orders

## 2021-02-20 NOTE — Patient Instructions (Signed)
Eddie Hutchinson , Thank you for taking time to come for your Medicare Wellness Visit. I appreciate your ongoing commitment to your health goals. Please review the following plan we discussed and let me know if I can assist you in the future.   Screening recommendations/referrals: Colonoscopy: not required Recommended yearly ophthalmology/optometry visit for glaucoma screening and checkup Recommended yearly dental visit for hygiene and checkup  Vaccinations: Influenza vaccine: completed 09/18/2020, due 06/09/2021 Pneumococcal vaccine: completed 06/01/2019 Tdap vaccine: completed 05/18/2019 Shingles vaccine: discussed   Covid-19:  10/22/2020, 02/07/2020, 01/12/2020  Advanced directives: Advance directive discussed with you today. Even though you declined this today please call our office should you change your mind and we can give you the proper paperwork for you to fill out.  Conditions/risks identified: none  Next appointment: Follow up in one year for your annual wellness visit.   Preventive Care 78 Years and Older, Male Preventive care refers to lifestyle choices and visits with your health care provider that can promote health and wellness. What does preventive care include?  A yearly physical exam. This is also called an annual well check.  Dental exams once or twice a year.  Routine eye exams. Ask your health care provider how often you should have your eyes checked.  Personal lifestyle choices, including:  Daily care of your teeth and gums.  Regular physical activity.  Eating a healthy diet.  Avoiding tobacco and drug use.  Limiting alcohol use.  Practicing safe sex.  Taking low doses of aspirin every day.  Taking vitamin and mineral supplements as recommended by your health care provider. What happens during an annual well check? The services and screenings done by your health care provider during your annual well check will depend on your age, overall health, lifestyle  risk factors, and family history of disease. Counseling  Your health care provider may ask you questions about your:  Alcohol use.  Tobacco use.  Drug use.  Emotional well-being.  Home and relationship well-being.  Sexual activity.  Eating habits.  History of falls.  Memory and ability to understand (cognition).  Work and work Statistician. Screening  You may have the following tests or measurements:  Height, weight, and BMI.  Blood pressure.  Lipid and cholesterol levels. These may be checked every 5 years, or more frequently if you are over 32 years old.  Skin check.  Lung cancer screening. You may have this screening every year starting at age 29 if you have a 30-pack-year history of smoking and currently smoke or have quit within the past 15 years.  Fecal occult blood test (FOBT) of the stool. You may have this test every year starting at age 13.  Flexible sigmoidoscopy or colonoscopy. You may have a sigmoidoscopy every 5 years or a colonoscopy every 10 years starting at age 67.  Prostate cancer screening. Recommendations will vary depending on your family history and other risks.  Hepatitis C blood test.  Hepatitis B blood test.  Sexually transmitted disease (STD) testing.  Diabetes screening. This is done by checking your blood sugar (glucose) after you have not eaten for a while (fasting). You may have this done every 1-3 years.  Abdominal aortic aneurysm (AAA) screening. You may need this if you are a current or former smoker.  Osteoporosis. You may be screened starting at age 54 if you are at high risk. Talk with your health care provider about your test results, treatment options, and if necessary, the need for more tests. Vaccines  Your health care provider may recommend certain vaccines, such as:  Influenza vaccine. This is recommended every year.  Tetanus, diphtheria, and acellular pertussis (Tdap, Td) vaccine. You may need a Td booster every 10  years.  Zoster vaccine. You may need this after age 59.  Pneumococcal 13-valent conjugate (PCV13) vaccine. One dose is recommended after age 45.  Pneumococcal polysaccharide (PPSV23) vaccine. One dose is recommended after age 16. Talk to your health care provider about which screenings and vaccines you need and how often you need them. This information is not intended to replace advice given to you by your health care provider. Make sure you discuss any questions you have with your health care provider. Document Released: 11/22/2015 Document Revised: 07/15/2016 Document Reviewed: 08/27/2015 Elsevier Interactive Patient Education  2017 Hand Prevention in the Home Falls can cause injuries. They can happen to people of all ages. There are many things you can do to make your home safe and to help prevent falls. What can I do on the outside of my home?  Regularly fix the edges of walkways and driveways and fix any cracks.  Remove anything that might make you trip as you walk through a door, such as a raised step or threshold.  Trim any bushes or trees on the path to your home.  Use bright outdoor lighting.  Clear any walking paths of anything that might make someone trip, such as rocks or tools.  Regularly check to see if handrails are loose or broken. Make sure that both sides of any steps have handrails.  Any raised decks and porches should have guardrails on the edges.  Have any leaves, snow, or ice cleared regularly.  Use sand or salt on walking paths during winter.  Clean up any spills in your garage right away. This includes oil or grease spills. What can I do in the bathroom?  Use night lights.  Install grab bars by the toilet and in the tub and shower. Do not use towel bars as grab bars.  Use non-skid mats or decals in the tub or shower.  If you need to sit down in the shower, use a plastic, non-slip stool.  Keep the floor dry. Clean up any water that  spills on the floor as soon as it happens.  Remove soap buildup in the tub or shower regularly.  Attach bath mats securely with double-sided non-slip rug tape.  Do not have throw rugs and other things on the floor that can make you trip. What can I do in the bedroom?  Use night lights.  Make sure that you have a light by your bed that is easy to reach.  Do not use any sheets or blankets that are too big for your bed. They should not hang down onto the floor.  Have a firm chair that has side arms. You can use this for support while you get dressed.  Do not have throw rugs and other things on the floor that can make you trip. What can I do in the kitchen?  Clean up any spills right away.  Avoid walking on wet floors.  Keep items that you use a lot in easy-to-reach places.  If you need to reach something above you, use a strong step stool that has a grab bar.  Keep electrical cords out of the way.  Do not use floor polish or wax that makes floors slippery. If you must use wax, use non-skid floor wax.  Do  not have throw rugs and other things on the floor that can make you trip. What can I do with my stairs?  Do not leave any items on the stairs.  Make sure that there are handrails on both sides of the stairs and use them. Fix handrails that are broken or loose. Make sure that handrails are as long as the stairways.  Check any carpeting to make sure that it is firmly attached to the stairs. Fix any carpet that is loose or worn.  Avoid having throw rugs at the top or bottom of the stairs. If you do have throw rugs, attach them to the floor with carpet tape.  Make sure that you have a light switch at the top of the stairs and the bottom of the stairs. If you do not have them, ask someone to add them for you. What else can I do to help prevent falls?  Wear shoes that:  Do not have high heels.  Have rubber bottoms.  Are comfortable and fit you well.  Are closed at the  toe. Do not wear sandals.  If you use a stepladder:  Make sure that it is fully opened. Do not climb a closed stepladder.  Make sure that both sides of the stepladder are locked into place.  Ask someone to hold it for you, if possible.  Clearly mark and make sure that you can see:  Any grab bars or handrails.  First and last steps.  Where the edge of each step is.  Use tools that help you move around (mobility aids) if they are needed. These include:  Canes.  Walkers.  Scooters.  Crutches.  Turn on the lights when you go into a dark area. Replace any light bulbs as soon as they burn out.  Set up your furniture so you have a clear path. Avoid moving your furniture around.  If any of your floors are uneven, fix them.  If there are any pets around you, be aware of where they are.  Review your medicines with your doctor. Some medicines can make you feel dizzy. This can increase your chance of falling. Ask your doctor what other things that you can do to help prevent falls. This information is not intended to replace advice given to you by your health care provider. Make sure you discuss any questions you have with your health care provider. Document Released: 08/22/2009 Document Revised: 04/02/2016 Document Reviewed: 11/30/2014 Elsevier Interactive Patient Education  2017 [employer].

## 2021-02-20 NOTE — Telephone Encounter (Deleted)
Transition Care Management Follow-up Telephone Call  Date of discharge and from where: 02/19/2021  How have you been since you were released from the hospital? Better but not great Any questions or concerns? No Items Reviewed:  Did the pt receive and understand the discharge instructions provided? Yes  Medications obtained and verified? yes  Any new allergies since your discharge? No  Dietary orders reviewed? Yes  Do you have support at home? Yes  Other (ie: DME, Home Health, etc) No  Functional Questionnaire: (I = Independent and D = Dependent)  Bathing/Dressing- I   Meal Prep- I  Eating- I  Maintaining continence- I  Transferring/Ambulation- I  Managing Meds- I   Follow up appointments reviewed:    PCP Hospital f/u appt confirmed? Yes  Specialist Hospital f/u appt confirmed? No specialist  Are transportation arrangements needed? No  If their condition worsens, is the pt aware to call  their PCP or go to the ED? Yes  Was the patient provided with contact information for the PCP's office or ED? Yes  Was the pt encouraged to call back with questions or concerns? Yes

## 2021-02-21 LAB — CMP14+EGFR
ALT: 11 IU/L (ref 0–44)
AST: 16 IU/L (ref 0–40)
Albumin/Globulin Ratio: 2 (ref 1.2–2.2)
Albumin: 4.3 g/dL (ref 3.7–4.7)
Alkaline Phosphatase: 38 IU/L — ABNORMAL LOW (ref 44–121)
BUN/Creatinine Ratio: 14 (ref 10–24)
BUN: 27 mg/dL (ref 8–27)
Bilirubin Total: 0.3 mg/dL (ref 0.0–1.2)
CO2: 21 mmol/L (ref 20–29)
Calcium: 9.5 mg/dL (ref 8.6–10.2)
Chloride: 105 mmol/L (ref 96–106)
Creatinine, Ser: 1.99 mg/dL — ABNORMAL HIGH (ref 0.76–1.27)
Globulin, Total: 2.2 g/dL (ref 1.5–4.5)
Glucose: 100 mg/dL — ABNORMAL HIGH (ref 65–99)
Potassium: 5 mmol/L (ref 3.5–5.2)
Sodium: 141 mmol/L (ref 134–144)
Total Protein: 6.5 g/dL (ref 6.0–8.5)
eGFR: 34 mL/min/{1.73_m2} — ABNORMAL LOW (ref >59)

## 2021-02-21 LAB — LIPID PANEL
Chol/HDL Ratio: 2.8 ratio (ref 0.0–5.0)
Cholesterol, Total: 127 mg/dL (ref 100–199)
HDL: 45 mg/dL (ref >39)
LDL Chol Calc (NIH): 69 mg/dL (ref 0–99)
Triglycerides: 64 mg/dL (ref 0–149)
VLDL Cholesterol Cal: 13 mg/dL (ref 5–40)

## 2021-02-21 LAB — HEMOGLOBIN A1C
Est. average glucose Bld gHb Est-mCnc: 105 mg/dL
Hgb A1c MFr Bld: 5.3 % (ref 4.8–5.6)

## 2021-03-15 ENCOUNTER — Other Ambulatory Visit: Payer: Self-pay | Admitting: Nurse Practitioner

## 2021-04-21 DIAGNOSIS — N1832 Chronic kidney disease, stage 3b: Secondary | ICD-10-CM | POA: Diagnosis not present

## 2021-04-21 DIAGNOSIS — N189 Chronic kidney disease, unspecified: Secondary | ICD-10-CM | POA: Diagnosis not present

## 2021-04-21 DIAGNOSIS — D631 Anemia in chronic kidney disease: Secondary | ICD-10-CM | POA: Diagnosis not present

## 2021-04-30 DIAGNOSIS — N1832 Chronic kidney disease, stage 3b: Secondary | ICD-10-CM | POA: Diagnosis not present

## 2021-04-30 DIAGNOSIS — N400 Enlarged prostate without lower urinary tract symptoms: Secondary | ICD-10-CM | POA: Diagnosis not present

## 2021-04-30 DIAGNOSIS — Z87442 Personal history of urinary calculi: Secondary | ICD-10-CM | POA: Diagnosis not present

## 2021-04-30 DIAGNOSIS — R7303 Prediabetes: Secondary | ICD-10-CM | POA: Diagnosis not present

## 2021-04-30 DIAGNOSIS — D631 Anemia in chronic kidney disease: Secondary | ICD-10-CM | POA: Diagnosis not present

## 2021-04-30 DIAGNOSIS — I129 Hypertensive chronic kidney disease with stage 1 through stage 4 chronic kidney disease, or unspecified chronic kidney disease: Secondary | ICD-10-CM | POA: Diagnosis not present

## 2021-04-30 DIAGNOSIS — E875 Hyperkalemia: Secondary | ICD-10-CM | POA: Diagnosis not present

## 2021-04-30 DIAGNOSIS — E1122 Type 2 diabetes mellitus with diabetic chronic kidney disease: Secondary | ICD-10-CM | POA: Diagnosis not present

## 2021-04-30 DIAGNOSIS — R809 Proteinuria, unspecified: Secondary | ICD-10-CM | POA: Diagnosis not present

## 2021-06-18 ENCOUNTER — Other Ambulatory Visit: Payer: Self-pay | Admitting: Nurse Practitioner

## 2021-06-20 ENCOUNTER — Other Ambulatory Visit: Payer: Self-pay | Admitting: Nurse Practitioner

## 2021-06-20 DIAGNOSIS — N183 Chronic kidney disease, stage 3 unspecified: Secondary | ICD-10-CM

## 2021-06-20 DIAGNOSIS — I129 Hypertensive chronic kidney disease with stage 1 through stage 4 chronic kidney disease, or unspecified chronic kidney disease: Secondary | ICD-10-CM

## 2021-06-23 ENCOUNTER — Ambulatory Visit (INDEPENDENT_AMBULATORY_CARE_PROVIDER_SITE_OTHER): Payer: Medicare PPO | Admitting: Nurse Practitioner

## 2021-06-23 ENCOUNTER — Other Ambulatory Visit: Payer: Self-pay

## 2021-06-23 ENCOUNTER — Encounter: Payer: Self-pay | Admitting: Nurse Practitioner

## 2021-06-23 VITALS — BP 130/60 | HR 74 | Temp 98.5°F | Ht 67.8 in | Wt 182.0 lb

## 2021-06-23 DIAGNOSIS — E782 Mixed hyperlipidemia: Secondary | ICD-10-CM

## 2021-06-23 DIAGNOSIS — Z6827 Body mass index (BMI) 27.0-27.9, adult: Secondary | ICD-10-CM | POA: Diagnosis not present

## 2021-06-23 DIAGNOSIS — R600 Localized edema: Secondary | ICD-10-CM

## 2021-06-23 DIAGNOSIS — Z79899 Other long term (current) drug therapy: Secondary | ICD-10-CM | POA: Diagnosis not present

## 2021-06-23 DIAGNOSIS — Z23 Encounter for immunization: Secondary | ICD-10-CM

## 2021-06-23 DIAGNOSIS — E663 Overweight: Secondary | ICD-10-CM | POA: Diagnosis not present

## 2021-06-23 DIAGNOSIS — Z0000 Encounter for general adult medical examination without abnormal findings: Secondary | ICD-10-CM

## 2021-06-23 DIAGNOSIS — I129 Hypertensive chronic kidney disease with stage 1 through stage 4 chronic kidney disease, or unspecified chronic kidney disease: Secondary | ICD-10-CM | POA: Diagnosis not present

## 2021-06-23 DIAGNOSIS — N183 Chronic kidney disease, stage 3 unspecified: Secondary | ICD-10-CM | POA: Diagnosis not present

## 2021-06-23 LAB — POCT URINALYSIS DIPSTICK
Bilirubin, UA: NEGATIVE
Blood, UA: NEGATIVE
Glucose, UA: NEGATIVE
Ketones, UA: NEGATIVE
Leukocytes, UA: NEGATIVE
Nitrite, UA: NEGATIVE
Protein, UA: NEGATIVE
Spec Grav, UA: 1.015 (ref 1.010–1.025)
Urobilinogen, UA: 0.2 E.U./dL
pH, UA: 5 (ref 5.0–8.0)

## 2021-06-23 LAB — POCT UA - MICROALBUMIN
Albumin/Creatinine Ratio, Urine, POC: 30
Creatinine, POC: 100 mg/dL
Microalbumin Ur, POC: 10 mg/L

## 2021-06-23 MED ORDER — SHINGRIX 50 MCG/0.5ML IM SUSR: 0.5000 mL | Freq: Once | INTRAMUSCULAR | 0 refills | Status: AC

## 2021-06-23 MED ORDER — FUROSEMIDE 20 MG PO TABS: 20.0000 mg | ORAL_TABLET | Freq: Every day | ORAL | 1 refills | Status: DC

## 2021-06-23 NOTE — Patient Instructions (Signed)
Health Maintenance, Male Adopting a healthy lifestyle and getting preventive care are important in promoting health and wellness. Ask your health care provider about: The right schedule for you to have regular tests and exams. Things you can do on your own to prevent diseases and keep yourself healthy. What should I know about diet, weight, and exercise? Eat a healthy diet  Eat a diet that includes plenty of vegetables, fruits, low-fat dairy products, and lean protein. Do not eat a lot of foods that are high in solid fats, added sugars, or sodium.  Maintain a healthy weight Body mass index (BMI) is a measurement that can be used to identify possible weight problems. It estimates body fat based on height and weight. Your health care provider can help determine your BMI and help you achieve or maintain ahealthy weight. Get regular exercise Get regular exercise. This is one of the most important things you can do for your health. Most adults should: Exercise for at least 150 minutes each week. The exercise should increase your heart rate and make you sweat (moderate-intensity exercise). Do strengthening exercises at least twice a week. This is in addition to the moderate-intensity exercise. Spend less time sitting. Even light physical activity can be beneficial. Watch cholesterol and blood lipids Have your blood tested for lipids and cholesterol at 79 years of age, then havethis test every 5 years. You may need to have your cholesterol levels checked more often if: Your lipid or cholesterol levels are high. You are older than 79 years of age. You are at high risk for heart disease. What should I know about cancer screening? Many types of cancers can be detected early and may often be prevented. Depending on your health history and family history, you may need to have cancer screening at various ages. This may include screening for: Colorectal cancer. Prostate cancer. Skin cancer. Lung  cancer. What should I know about heart disease, diabetes, and high blood pressure? Blood pressure and heart disease High blood pressure causes heart disease and increases the risk of stroke. This is more likely to develop in people who have high blood pressure readings, are of African descent, or are overweight. Talk with your health care provider about your target blood pressure readings. Have your blood pressure checked: Every 3-5 years if you are 18-39 years of age. Every year if you are 40 years old or older. If you are between the ages of 65 and 75 and are a current or former smoker, ask your health care provider if you should have a one-time screening for abdominal aortic aneurysm (AAA). Diabetes Have regular diabetes screenings. This checks your fasting blood sugar level. Have the screening done: Once every three years after age 45 if you are at a normal weight and have a low risk for diabetes. More often and at a younger age if you are overweight or have a high risk for diabetes. What should I know about preventing infection? Hepatitis B If you have a higher risk for hepatitis B, you should be screened for this virus. Talk with your health care provider to find out if you are at risk forhepatitis B infection. Hepatitis C Blood testing is recommended for: Everyone born from 1945 through 1965. Anyone with known risk factors for hepatitis C. Sexually transmitted infections (STIs) You should be screened each year for STIs, including gonorrhea and chlamydia, if: You are sexually active and are younger than 79 years of age. You are older than 79 years of age   and your health care provider tells you that you are at risk for this type of infection. Your sexual activity has changed since you were last screened, and you are at increased risk for chlamydia or gonorrhea. Ask your health care provider if you are at risk. Ask your health care provider about whether you are at high risk for HIV.  Your health care provider may recommend a prescription medicine to help prevent HIV infection. If you choose to take medicine to prevent HIV, you should first get tested for HIV. You should then be tested every 3 months for as long as you are taking the medicine. Follow these instructions at home: Lifestyle Do not use any products that contain nicotine or tobacco, such as cigarettes, e-cigarettes, and chewing tobacco. If you need help quitting, ask your health care provider. Do not use street drugs. Do not share needles. Ask your health care provider for help if you need support or information about quitting drugs. Alcohol use Do not drink alcohol if your health care provider tells you not to drink. If you drink alcohol: Limit how much you have to 0-2 drinks a day. Be aware of how much alcohol is in your drink. In the [place] one drink equals one 12 oz bottle of beer (355 mL), one 5 oz glass of wine (148 mL), or one 1 oz glass of hard liquor (44 mL). General instructions Schedule regular health, dental, and eye exams. Stay current with your vaccines. Tell your health care provider if: You often feel depressed. You have ever been abused or do not feel safe at home. Summary Adopting a healthy lifestyle and getting preventive care are important in promoting health and wellness. Follow your health care provider's instructions about healthy diet, exercising, and getting tested or screened for diseases. Follow your health care provider's instructions on monitoring your cholesterol and blood pressure. This information is not intended to replace advice given to you by your health care provider. Make sure you discuss any questions you have with your healthcare provider. Document Revised: 10/19/2018 Document Reviewed: 10/19/2018 Elsevier Patient Education  2022 Elsevier Inc.  

## 2021-06-23 NOTE — Progress Notes (Signed)
I,Tianna Badgett,acting as a Education administrator for [employer], FNP.,have documented all relevant documentation on the behalf of [name], FNP,as directed by  [name], FNP while in the presence of [name], [place].  This visit occurred during the SARS-CoV-2 public health emergency.  Safety protocols were in place, including screening questions prior to the visit, additional usage of staff PPE, and extensive cleaning of exam room while observing appropriate contact time as indicated for disinfecting solutions.  Subjective:     Patient ID: Eddie Hutchinson , male    DOB: February 27, 1941 , 80 y.o.   MRN: [id number]   Chief Complaint  Patient presents with   Annual Exam    HPI  Here for HM.  Continues to be followed by Dr. Harrie Jeans - Nephrologist next appt is September. He is to call Cardiology for an appt - Dr. Acie Fredrickson.    Hypertension This is a chronic problem. The current episode started more than 1 year ago. The problem has been gradually worsening since onset. The problem is uncontrolled. Pertinent negatives include no anxiety. There are no associated agents to hypertension. Risk factors for coronary artery disease include sedentary lifestyle. Past treatments include calcium channel blockers. Compliance problems include exercise.  There is no history of angina, kidney disease or CAD/MI. There is no history of chronic renal disease.    Past Medical History:  Diagnosis Date   Abnormal EKG 11/24/2016   LAFB with LVH and QRS widening   Benign essential HTN 11/24/2016   Family history of early CAD 11/24/2016   Glucose intolerance (impaired glucose tolerance)    Hyperlipidemia    Hyperlipidemia 11/24/2016   Hypertension    Kidney stone    PVC's (premature ventricular contractions) 11/24/2016   Vitamin D deficiency      Family History  Problem Relation Age of Onset   Heart failure Mother    Heart attack Father 31   Heart disease Father    CVA Brother    Cancer Brother      Current  Outpatient Medications:    amLODipine (NORVASC) 10 MG tablet, TAKE 1 TABLET BY MOUTH EVERY DAY, Disp: 90 tablet, Rfl: 1   Ergocalciferol (VITAMIN D2 PO), Take 1,000 Units by mouth daily. , Disp: , Rfl:    fenofibrate 160 MG tablet, TAKE 1 TABLET BY MOUTH EVERY DAY, Disp: 90 tablet, Rfl: 0   ferrous sulfate 325 (65 FE) MG tablet, TAKE 1 TABLET BY MOUTH EVERY DAY, Disp: 90 tablet, Rfl: 1   furosemide (LASIX) 20 MG tablet, Take 2 tablets (40 mg total) by mouth daily., Disp: 180 tablet, Rfl: 3   hydrALAZINE (APRESOLINE) 100 MG tablet, Take 100 mg by mouth 3 (three) times daily., Disp: , Rfl:    isosorbide mononitrate (IMDUR) 30 MG 24 hr tablet, Take 1 tablet by mouth daily., Disp: , Rfl:    Allergies  Allergen Reactions   Bee Venom Hives    As a child      Men's preventive visit. Patient Health Questionnaire (PHQ-2) is  Flowsheet Row Clinical Support from 02/20/2021 in [facility]  PHQ-2 Total Score 0     Patient is on a regular diet, tries to eat a low salt diet.  Exercising minimal other than yard work. Marital status: Widowed. Relevant history for alcohol use is:  Social History   Substance and Sexual Activity  Alcohol Use Yes   Comment: occassionally   Relevant history for tobacco use is:  Social History   Tobacco Use  Smoking Status Former  Smokeless Tobacco Never  .   Review of Systems  Constitutional: Negative.   HENT: Negative.    Eyes: Negative.   Respiratory: Negative.    Cardiovascular:  Positive for leg swelling (reports having leg swelling one month ago - none today).  Gastrointestinal: Negative.   Endocrine: Negative.   Genitourinary: Negative.   Musculoskeletal: Negative.   Skin: Negative.   Allergic/Immunologic: Negative.   Neurological: Negative.   Hematological: Negative.   Psychiatric/Behavioral: Negative.      Today's Vitals   06/23/21 1027  BP: 130/60  Pulse: 74  Temp: 98.5 F (36.9 C)  TempSrc: Oral  Weight: 182 lb  (82.6 kg)  Height: 5' 7.8" (1.722 m)   Body mass index is 27.84 kg/m.  Wt Readings from Last 3 Encounters:  07/21/21 178 lb 3.2 oz (80.8 kg)  06/23/21 182 lb (82.6 kg)  02/20/21 185 lb 3 oz (84 kg)    Objective:  Physical Exam Vitals reviewed.  Constitutional:      General: He is not in acute distress.    Appearance: Normal appearance.  HENT:     Head: Normocephalic and atraumatic.     Right Ear: Tympanic membrane, ear canal and external ear normal. There is no impacted cerumen.     Left Ear: Tympanic membrane, ear canal and external ear normal. There is no impacted cerumen.     Nose:     Comments: Deferred - masked    Mouth/Throat:     Comments: Deferred - masked Cardiovascular:     Rate and Rhythm: Normal rate and regular rhythm.     Pulses: Normal pulses.     Heart sounds: Normal heart sounds. No murmur heard. Pulmonary:     Effort: Pulmonary effort is normal. No respiratory distress.     Breath sounds: Normal breath sounds. No wheezing.  Abdominal:     General: Abdomen is flat. Bowel sounds are normal. There is no distension.     Palpations: Abdomen is soft.  Genitourinary:    Prostate: Normal.     Rectum: Guaiac result negative.  Musculoskeletal:        General: No tenderness. Normal range of motion.     Cervical back: Normal range of motion and neck supple.     Right lower leg: Edema (trace) present.     Left lower leg: Edema (trace) present.  Skin:    General: Skin is warm.     Capillary Refill: Capillary refill takes less than 2 seconds.  Neurological:     General: No focal deficit present.     Mental Status: He is alert and oriented to person, place, and time.     Cranial Nerves: No cranial nerve deficit.     Motor: No weakness.  Psychiatric:        Mood and Affect: Mood normal.        Behavior: Behavior normal.        Thought Content: Thought content normal.        Judgment: Judgment normal.        Assessment And Plan:    1. Health maintenance  examination - CBC  2. Benign hypertension with CKD (chronic kidney disease) stage III (HCC) Comments: Good control, continue follow up with Dr. Harrie Jeans (Nephrology) - POCT Urinalysis Dipstick (81002) - POCT UA - Microalbumin - EKG 12-Lead - CMP14+EGFR  3. Mixed hyperlipidemia Comments: Stable, continue current medications - CMP14+EGFR - Lipid panel  4. Localized edema Comments: Trace edema noted  to bilateral lowre extremities Encouraged to elevate lower extremities when possible and wear support socks.  5. Overweight with body mass index (BMI) of 27 to 27.9 in adult He is encouraged to initially strive for BMI less than 26 to decrease cardiac risk. He is advised to exercise no less than 150 minutes per week.   6. Encounter for immunization Rx sent to pharmacy - Zoster Vaccine Adjuvanted [facility]) injection; Inject 0.5 mLs into the muscle once for 1 dose.  Dispense: 0.5 mL; Refill: 0  7. Other long term (current) drug therapy - CBC      Patient was given opportunity to ask questions. Patient verbalized understanding of the plan and was able to repeat key elements of the plan. All questions were answered to their satisfaction.   [name], FNP   I, [name], FNP, have reviewed all documentation for this visit. The documentation on 06/23/21 for the exam, diagnosis, procedures, and orders are all accurate and complete.   THE PATIENT IS ENCOURAGED TO PRACTICE SOCIAL DISTANCING DUE TO THE COVID-19 PANDEMIC.

## 2021-06-24 LAB — LIPID PANEL
Chol/HDL Ratio: 2.4 ratio (ref 0.0–5.0)
Cholesterol, Total: 123 mg/dL (ref 100–199)
HDL: 51 mg/dL (ref >39)
LDL Chol Calc (NIH): 59 mg/dL (ref 0–99)
Triglycerides: 62 mg/dL (ref 0–149)
VLDL Cholesterol Cal: 13 mg/dL (ref 5–40)

## 2021-06-24 LAB — CMP14+EGFR
ALT: 15 IU/L (ref 0–44)
AST: 19 IU/L (ref 0–40)
Albumin/Globulin Ratio: 1.8 (ref 1.2–2.2)
Albumin: 4.2 g/dL (ref 3.7–4.7)
Alkaline Phosphatase: 41 IU/L — ABNORMAL LOW (ref 44–121)
BUN/Creatinine Ratio: 16 (ref 10–24)
BUN: 32 mg/dL — ABNORMAL HIGH (ref 8–27)
Bilirubin Total: 0.2 mg/dL (ref 0.0–1.2)
CO2: 20 mmol/L (ref 20–29)
Calcium: 9.6 mg/dL (ref 8.6–10.2)
Chloride: 106 mmol/L (ref 96–106)
Creatinine, Ser: 2.04 mg/dL — ABNORMAL HIGH (ref 0.76–1.27)
Globulin, Total: 2.4 g/dL (ref 1.5–4.5)
Glucose: 92 mg/dL (ref 65–99)
Potassium: 4.7 mmol/L (ref 3.5–5.2)
Sodium: 141 mmol/L (ref 134–144)
Total Protein: 6.6 g/dL (ref 6.0–8.5)
eGFR: 33 mL/min/{1.73_m2} — ABNORMAL LOW (ref >59)

## 2021-06-24 LAB — CBC
Hematocrit: 32.7 % — ABNORMAL LOW (ref 37.5–51.0)
Hemoglobin: 10.9 g/dL — ABNORMAL LOW (ref 13.0–17.7)
MCH: 28.2 pg (ref 26.6–33.0)
MCHC: 33.3 g/dL (ref 31.5–35.7)
MCV: 85 fL (ref 79–97)
Platelets: 526 10*3/uL — ABNORMAL HIGH (ref 150–450)
RBC: 3.86 x10E6/uL — ABNORMAL LOW (ref 4.14–5.80)
RDW: 17.1 % — ABNORMAL HIGH (ref 11.6–15.4)
WBC: 7.8 10*3/uL (ref 3.4–10.8)

## 2021-07-09 DIAGNOSIS — E785 Hyperlipidemia, unspecified: Secondary | ICD-10-CM | POA: Diagnosis not present

## 2021-07-09 DIAGNOSIS — Z8249 Family history of ischemic heart disease and other diseases of the circulatory system: Secondary | ICD-10-CM | POA: Diagnosis not present

## 2021-07-09 DIAGNOSIS — Z87891 Personal history of nicotine dependence: Secondary | ICD-10-CM | POA: Diagnosis not present

## 2021-07-09 DIAGNOSIS — D649 Anemia, unspecified: Secondary | ICD-10-CM | POA: Diagnosis not present

## 2021-07-09 DIAGNOSIS — I10 Essential (primary) hypertension: Secondary | ICD-10-CM | POA: Diagnosis not present

## 2021-07-09 DIAGNOSIS — R609 Edema, unspecified: Secondary | ICD-10-CM | POA: Diagnosis not present

## 2021-07-09 DIAGNOSIS — Z604 Social exclusion and rejection: Secondary | ICD-10-CM | POA: Diagnosis not present

## 2021-07-09 DIAGNOSIS — I2510 Atherosclerotic heart disease of native coronary artery without angina pectoris: Secondary | ICD-10-CM | POA: Diagnosis not present

## 2021-07-09 DIAGNOSIS — Z809 Family history of malignant neoplasm, unspecified: Secondary | ICD-10-CM | POA: Diagnosis not present

## 2021-07-21 ENCOUNTER — Encounter: Payer: Self-pay | Admitting: Cardiovascular Disease

## 2021-07-21 ENCOUNTER — Other Ambulatory Visit: Payer: Self-pay

## 2021-07-21 ENCOUNTER — Ambulatory Visit: Payer: Medicare PPO | Admitting: Cardiovascular Disease

## 2021-07-21 VITALS — BP 142/68 | HR 75 | Ht 68.5 in | Wt 178.2 lb

## 2021-07-21 DIAGNOSIS — R0989 Other specified symptoms and signs involving the circulatory and respiratory systems: Secondary | ICD-10-CM

## 2021-07-21 DIAGNOSIS — E782 Mixed hyperlipidemia: Secondary | ICD-10-CM

## 2021-07-21 DIAGNOSIS — N1832 Chronic kidney disease, stage 3b: Secondary | ICD-10-CM | POA: Diagnosis not present

## 2021-07-21 DIAGNOSIS — I10 Essential (primary) hypertension: Secondary | ICD-10-CM | POA: Diagnosis not present

## 2021-07-21 MED ORDER — FUROSEMIDE 20 MG PO TABS: 40.0000 mg | ORAL_TABLET | Freq: Every day | ORAL | 3 refills | Status: DC

## 2021-07-21 NOTE — Patient Instructions (Addendum)
Medication Instructions:  Your physician has recommended you make the following change in your medication:  INCREASE Furosemide (Lasix) to 40 mg every morning  *If you need a refill on your cardiac medications before your next appointment, please call your pharmacy*   Lab Work: Your physician recommends that you return for lab work in: 3 weeks on Tuesday Oct. 4. You may come in anytime between the hours of 7:30 am and 4:45 pm. You do not have to fast for this appointment.  If you have labs (blood work) drawn today and your tests are completely normal, you will receive your results only by: [place] (if you have MyChart) OR A paper copy in the mail If you have any lab test that is abnormal or we need to change your treatment, we will call you to review the results.   Testing/Procedures: None Ordered   Follow-Up:  At [facility], you and your health needs are our priority.  As part of our continuing mission to provide you with exceptional heart care, we have created designated Provider Care Teams.  These Care Teams include your primary Cardiologist (physician) and Advanced Practice Providers (APPs -  Physician Assistants and Nurse Practitioners) who all work together to provide you with the care you need, when you need it.  We recommend signing up for the patient portal called "MyChart".  Sign up information is provided on this After Visit Summary.  MyChart is used to connect with patients for Virtual Visits (Telemedicine).  Patients are able to view lab/test results, encounter notes, upcoming appointments, etc.  Non-urgent messages can be sent to your provider as well.   To learn more about what you can do with MyChart, go to [email or web address].    Your next appointment:   6 month(s) on Friday March 17 at 2:00 pm  The format for your next appointment:   In Person  Provider:   Mertie Moores, MD

## 2021-07-21 NOTE — Progress Notes (Signed)
Cardiology Office Note:    Date:  07/21/2021   ID:  Eddie Hutchinson, DOB 08/16/1941, MRN [id number]  PCP:  [name], [facility] Cardiologist:  Stark Jock  [place] Electrophysiologist:  None   Referring MD: [name], FNP   Chief Complaint  Patient presents with   carotid bruit   Hypertension   Hyperlipidemia  carotid briut  Sept. 13, 2021   Eddie Hutchinson is a 80 y.o. male with a hx of HTN, HLD and a carotid bruit.   We are asked to see him by [name], FNP for further eval and management of hisl eft  carotid bruit  He has been seen by Dr. Radford Pax I the past ( Jan. 2018)  Echo during that evaluation revealed a normal LV EF of 55-60%.  Has mild LAE Stress myoview showed no evidence of ischemia   Lipids from his primary MD were reviewed His LDL is 64.   HDL = 40,  total col = 119 Trigs = 75  No stroke or TIA symptoms.  No CP or dyspea. Fairly active ,   Does his yard work ,  Engineer, manufacturing systems occasionally  Does not walk , No arm or leg weakness. Has CKD.   Se[facility] Royce [facility]adamia at [facility]  Sept. 12, 2022: Eddie Hutchinson is seen for follow up of his HTN, HLD, carotid bruit  Is avoiding salty foods   Past Medical History:  Diagnosis Date   Abnormal EKG 11/24/2016   LAFB with LVH and QRS widening   Benign essential HTN 11/24/2016   Family history of early CAD 11/24/2016   Glucose intolerance (impaired glucose tolerance)    Hyperlipidemia    Hyperlipidemia 11/24/2016   Hypertension    Kidney stone    PVC's (premature ventricular contractions) 11/24/2016   Vitamin D deficiency     Past Surgical History:  Procedure Laterality Date   CYSTOSCOPY/RETROGRADE/URETEROSCOPY/STONE EXTRACTION WITH BASKET     KIDNEY STONE SURGERY     LITHOTRIPSY     TOOTH EXTRACTION Left 01/2021    Current Medications: Current Meds  Medication Sig   amLODipine (NORVASC) 10 MG tablet TAKE 1 TABLET BY MOUTH EVERY DAY   Ergocalciferol (VITAMIN D2 PO) Take 1,000 Units by  mouth daily.    fenofibrate 160 MG tablet TAKE 1 TABLET BY MOUTH EVERY DAY   ferrous sulfate 325 (65 FE) MG tablet TAKE 1 TABLET BY MOUTH EVERY DAY   furosemide (LASIX) 20 MG tablet Take 2 tablets (40 mg total) by mouth daily.   hydrALAZINE (APRESOLINE) 100 MG tablet Take 100 mg by mouth 3 (three) times daily.   isosorbide mononitrate (IMDUR) 30 MG 24 hr tablet Take 1 tablet by mouth daily.   [DISCONTINUED] furosemide (LASIX) 20 MG tablet Take 1 tablet (20 mg total) by mouth daily.     Allergies:   Bee venom   Social History   Socioeconomic History   Marital status: Widowed    Spouse name: Not on file   Number of children: Not on file   Years of education: Not on file   Highest education level: Not on file  Occupational History   Occupation: retired  Tobacco Use   Smoking status: Former   Smokeless tobacco: Never  Scientific laboratory technician Use: Never used  Substance and Sexual Activity   Alcohol use: Yes    Comment: occassionally   Drug use: No   Sexual activity: Not Currently  Other Topics Concern   Not  on file  Social History Narrative   Not on file   Social Determinants of Health   Financial Resource Strain: Low Risk    Difficulty of Paying Living Expenses: Not hard at all  Food Insecurity: No Food Insecurity   Worried About Charity fundraiser in the Last Year: Never true   Arboriculturist in the Last Year: Never true  Transportation Needs: No Transportation Needs   Lack of Transportation (Medical): No   Lack of Transportation (Non-Medical): No  Physical Activity: Inactive   Days of Exercise per Week: 0 days   Minutes of Exercise per Session: 0 min  Stress: No Stress Concern Present   Feeling of Stress : Not at all  Social Connections: Not on file     Family History: The patient's family history includes CVA in his brother; Cancer in his brother; Heart attack (age of onset: 86) in his father; Heart disease in his father; Heart failure in his mother.  ROS:    Please see the history of present illness.     All other systems reviewed and are negative.  EKGs/Labs/Other Studies Reviewed:    The following studies were reviewed today:    Recent Labs: 06/23/2021: ALT 15; BUN 32; Creatinine, Ser 2.04; Hemoglobin 10.9; Platelets 526; Potassium 4.7; Sodium 141  Recent Lipid Panel    Component Value Date/Time   CHOL 123 06/23/2021 1412   TRIG 62 06/23/2021 1412   HDL 51 06/23/2021 1412   CHOLHDL 2.4 06/23/2021 1412   LDLCALC 59 06/23/2021 1412    Physical Exam:    Physical Exam: Blood pressure (!) 142/68, pulse 75, height 5' 8.5" (1.74 m), weight 178 lb 3.2 oz (80.8 kg), SpO2 98 %.  GEN:  Well nourished, well developed in no acute distress HEENT: Normal NECK: No JVD; moderate left carotid bruit LYMPHATICS: No lymphadenopathy CARDIAC: RRR soft systolic murmur. RESPIRATORY:  Clear to auscultation without rales, wheezing or rhonchi  ABDOMEN: Soft, non-tender, non-distended MUSCULOSKELETAL:  No edema; No deformity  SKIN: Warm and dry NEUROLOGIC:  Alert and oriented x 3   EKG: July 21, 2021: Normal sinus rhythm.  Frequent premature ventricular contractions.   ASSESSMENT:    1. Essential hypertension   2. Mixed hyperlipidemia   3. Carotid bruit, unspecified laterality   4. Bruit of left carotid artery     PLAN:     1.   Carotid bruit:   He has mild carotid artery disease by duplex scan in September, 2021.  We will continue to follow his carotid duplex scan periodically.  2.  Systolic murmur :      3.  Hyperlipidemia: Fairly stable.   4.  HTN: He is on Lasix 20 mg a day but really does not have much urine output with this.  We will increase his Lasix to 40 mg a day.  We will check basic metabolic profile in 3 weeks.  I will have him follow-up with hypertension clinic in approximately 2 months.  I will anticipate seeing him back in 6 months for follow-up visit.   Medication Adjustments/Labs and Tests Ordered: Current  medicines are reviewed at length with the patient today.  Concerns regarding medicines are outlined above.  Orders Placed This Encounter  Procedures   Basic Metabolic Panel (BMET)   EKG 12-Lead    Meds ordered this encounter  Medications   furosemide (LASIX) 20 MG tablet    Sig: Take 2 tablets (40 mg total) by mouth daily.  Dispense:  180 tablet    Refill:  3    Order Specific Question:   Supervising Provider    Answer:   Thayer Headings [phone]      Patient Instructions  Medication Instructions:  Your physician has recommended you make the following change in your medication:  INCREASE Furosemide (Lasix) to 40 mg every morning  *If you need a refill on your cardiac medications before your next appointment, please call your pharmacy*   Lab Work: Your physician recommends that you return for lab work in: 3 weeks on Tuesday Oct. 4. You may come in anytime between the hours of 7:30 am and 4:45 pm. You do not have to fast for this appointment.  If you have labs (blood work) drawn today and your tests are completely normal, you will receive your results only by: [place] (if you have MyChart) OR A paper copy in the mail If you have any lab test that is abnormal or we need to change your treatment, we will call you to review the results.   Testing/Procedures: None Ordered   Follow-Up:  At [facility], you and your health needs are our priority.  As part of our continuing mission to provide you with exceptional heart care, we have created designated Provider Care Teams.  These Care Teams include your primary Cardiologist (physician) and Advanced Practice Providers (APPs -  Physician Assistants and Nurse Practitioners) who all work together to provide you with the care you need, when you need it.  We recommend signing up for the patient portal called "MyChart".  Sign up information is provided on this After Visit Summary.  MyChart is used to connect with patients for  Virtual Visits (Telemedicine).  Patients are able to view lab/test results, encounter notes, upcoming appointments, etc.  Non-urgent messages can be sent to your provider as well.   To learn more about what you can do with MyChart, go to [email or web address].    Your next appointment:   6 month(s) on Friday March 17 at 2:00 pm  The format for your next appointment:   In Person  Provider:   Mertie Moores, MD      Signed, Mertie Moores, MD  07/21/2021 5:25 PM    [place]

## 2021-07-28 DIAGNOSIS — R7303 Prediabetes: Secondary | ICD-10-CM | POA: Diagnosis not present

## 2021-07-28 DIAGNOSIS — N1832 Chronic kidney disease, stage 3b: Secondary | ICD-10-CM | POA: Diagnosis not present

## 2021-07-28 DIAGNOSIS — E875 Hyperkalemia: Secondary | ICD-10-CM | POA: Diagnosis not present

## 2021-07-28 DIAGNOSIS — Z87442 Personal history of urinary calculi: Secondary | ICD-10-CM | POA: Diagnosis not present

## 2021-07-28 DIAGNOSIS — D631 Anemia in chronic kidney disease: Secondary | ICD-10-CM | POA: Diagnosis not present

## 2021-07-28 DIAGNOSIS — N400 Enlarged prostate without lower urinary tract symptoms: Secondary | ICD-10-CM | POA: Diagnosis not present

## 2021-07-28 DIAGNOSIS — R809 Proteinuria, unspecified: Secondary | ICD-10-CM | POA: Diagnosis not present

## 2021-07-28 DIAGNOSIS — I129 Hypertensive chronic kidney disease with stage 1 through stage 4 chronic kidney disease, or unspecified chronic kidney disease: Secondary | ICD-10-CM | POA: Diagnosis not present

## 2021-07-28 DIAGNOSIS — E1122 Type 2 diabetes mellitus with diabetic chronic kidney disease: Secondary | ICD-10-CM | POA: Diagnosis not present

## 2021-08-12 ENCOUNTER — Other Ambulatory Visit: Payer: Medicare PPO | Admitting: *Deleted

## 2021-08-12 ENCOUNTER — Other Ambulatory Visit: Payer: Self-pay

## 2021-08-12 ENCOUNTER — Encounter (INDEPENDENT_AMBULATORY_CARE_PROVIDER_SITE_OTHER): Payer: Self-pay

## 2021-08-12 DIAGNOSIS — R0989 Other specified symptoms and signs involving the circulatory and respiratory systems: Secondary | ICD-10-CM

## 2021-08-12 DIAGNOSIS — I10 Essential (primary) hypertension: Secondary | ICD-10-CM

## 2021-08-12 DIAGNOSIS — E782 Mixed hyperlipidemia: Secondary | ICD-10-CM

## 2021-08-12 LAB — BASIC METABOLIC PANEL
BUN/Creatinine Ratio: 16 (ref 10–24)
BUN: 36 mg/dL — ABNORMAL HIGH (ref 8–27)
CO2: 20 mmol/L (ref 20–29)
Calcium: 9.5 mg/dL (ref 8.6–10.2)
Chloride: 104 mmol/L (ref 96–106)
Creatinine, Ser: 2.23 mg/dL — ABNORMAL HIGH (ref 0.76–1.27)
Glucose: 94 mg/dL (ref 70–99)
Potassium: 4.2 mmol/L (ref 3.5–5.2)
Sodium: 140 mmol/L (ref 134–144)
eGFR: 29 mL/min/{1.73_m2} — ABNORMAL LOW (ref >59)

## 2021-08-13 ENCOUNTER — Other Ambulatory Visit: Payer: Self-pay | Admitting: Nurse Practitioner

## 2021-09-29 ENCOUNTER — Other Ambulatory Visit: Payer: Self-pay

## 2021-09-29 ENCOUNTER — Ambulatory Visit (INDEPENDENT_AMBULATORY_CARE_PROVIDER_SITE_OTHER): Payer: Medicare PPO | Admitting: Nurse Practitioner

## 2021-09-29 ENCOUNTER — Encounter: Payer: Self-pay | Admitting: Nurse Practitioner

## 2021-09-29 VITALS — BP 132/70 | HR 57 | Temp 98.8°F | Ht 68.5 in | Wt 177.8 lb

## 2021-09-29 DIAGNOSIS — E782 Mixed hyperlipidemia: Secondary | ICD-10-CM

## 2021-09-29 DIAGNOSIS — Z23 Encounter for immunization: Secondary | ICD-10-CM | POA: Diagnosis not present

## 2021-09-29 DIAGNOSIS — I129 Hypertensive chronic kidney disease with stage 1 through stage 4 chronic kidney disease, or unspecified chronic kidney disease: Secondary | ICD-10-CM | POA: Diagnosis not present

## 2021-09-29 DIAGNOSIS — N183 Chronic kidney disease, stage 3 unspecified: Secondary | ICD-10-CM | POA: Diagnosis not present

## 2021-09-29 NOTE — Progress Notes (Signed)
Eddie Hutchinson,acting as a Education administrator for [employer], FNP.,have documented all relevant documentation on the behalf of Eddie Brine, FNP,as directed by  Eddie Brine, FNP while in the presence of Eddie Hutchinson, [place].  This visit occurred during the SARS-CoV-2 public health emergency.  Safety protocols were in place, including screening questions prior to the visit, additional usage of staff PPE, and extensive cleaning of exam room while observing appropriate contact time as indicated for disinfecting solutions.  Subjective:     Patient ID: Eddie Hutchinson , male    DOB: Nov 09, 1941 , 80 y.o.   MRN: [id number]   Chief Complaint  Patient presents with   Hypertension    HPI  Presents today for his six month HTN follow up.  He has seen Dr. Harrie Jeans who increased his furosemide to 40 mg daily. It was also recommended he change from fenofibrate to another agent due to his kidney function.   Hypertension This is a chronic problem. The current episode started more than 1 year ago. The problem has been gradually improving since onset. The problem is controlled. Pertinent negatives include no blurred vision, chest pain, headaches, palpitations, peripheral edema or shortness of breath. There are no associated agents to hypertension. Risk factors for coronary artery disease include male gender and family history. Past treatments include alpha 1 blockers and calcium channel blockers. The current treatment provides mild improvement. There are no compliance problems.  Hypertensive end-organ damage includes CAD/MI. There is no history of CVA or left ventricular hypertrophy. Identifiable causes of hypertension include chronic renal disease (stage 2-3 kidney ).    Past Medical History:  Diagnosis Date   Abnormal EKG 11/24/2016   LAFB with LVH and QRS widening   Benign essential HTN 11/24/2016   Family history of early CAD 11/24/2016   Glucose intolerance (impaired glucose tolerance)    Hyperlipidemia     Hyperlipidemia 11/24/2016   Hypertension    Kidney stone    PVC's (premature ventricular contractions) 11/24/2016   Vitamin D deficiency      Family History  Problem Relation Age of Onset   Heart failure Mother    Heart attack Father 88   Heart disease Father    CVA Brother    Cancer Brother      Current Outpatient Medications:    amLODipine (NORVASC) 10 MG tablet, TAKE 1 TABLET BY MOUTH EVERY DAY, Disp: 90 tablet, Rfl: 1   Ergocalciferol (VITAMIN D2 PO), Take 1,000 Units by mouth daily. , Disp: , Rfl:    fenofibrate 160 MG tablet, TAKE 1 TABLET BY MOUTH EVERY DAY, Disp: 90 tablet, Rfl: 0   ferrous sulfate 325 (65 FE) MG tablet, TAKE 1 TABLET BY MOUTH EVERY DAY, Disp: 90 tablet, Rfl: 1   furosemide (LASIX) 20 MG tablet, Take 2 tablets (40 mg total) by mouth daily., Disp: 180 tablet, Rfl: 3   hydrALAZINE (APRESOLINE) 100 MG tablet, Take 100 mg by mouth 3 (three) times daily., Disp: , Rfl:    isosorbide mononitrate (IMDUR) 30 MG 24 hr tablet, Take 1 tablet by mouth daily., Disp: , Rfl:    Allergies  Allergen Reactions   Bee Venom Hives    As a child      Review of Systems  Constitutional: Negative.   Eyes:  Negative for blurred vision.  Respiratory: Negative.  Negative for shortness of breath.   Cardiovascular: Negative.  Negative for chest pain and palpitations.  Gastrointestinal: Negative.   Neurological:  Negative for headaches.  Psychiatric/Behavioral: Negative.  All other systems reviewed and are negative.   Today's Vitals   09/29/21 0849  BP: 132/70  Pulse: (!) 57  Temp: 98.8 F (37.1 C)  Weight: 177 lb 12.8 oz (80.6 kg)  Height: 5' 8.5" (1.74 m)  PainSc: 0-No pain   Body mass index is 26.64 kg/m.  Wt Readings from Last 3 Encounters:  09/29/21 177 lb 12.8 oz (80.6 kg)  07/21/21 178 lb 3.2 oz (80.8 kg)  06/23/21 182 lb (82.6 kg)    BP Readings from Last 3 Encounters:  09/29/21 132/70  07/21/21 (!) 142/68  06/23/21 130/60    Objective:  Physical  Exam Constitutional:      General: He is not in acute distress.    Appearance: Normal appearance.  Cardiovascular:     Rate and Rhythm: Normal rate and regular rhythm.     Pulses: Normal pulses.     Heart sounds: Normal heart sounds. No murmur heard. Pulmonary:     Effort: Pulmonary effort is normal. No respiratory distress.     Breath sounds: Normal breath sounds. No wheezing.  Skin:    Capillary Refill: Capillary refill takes less than 2 seconds.  Neurological:     General: No focal deficit present.     Mental Status: He is alert and oriented to person, place, and time.     Cranial Nerves: No cranial nerve deficit.     Motor: No weakness.  Psychiatric:        Mood and Affect: Mood normal.        Behavior: Behavior normal.        Thought Content: Thought content normal.        Judgment: Judgment normal.        Assessment And Plan:     1. Benign hypertension with CKD (chronic kidney disease) stage III (HCC) Comments: Blood pressure is well controlled, continue follow up with Cardiology and Nephrology  - BMP8+EGFR  2. Mixed hyperlipidemia Comments: Will check to see if can change from fenofibrate to statin Currently cholesterol is normal. - Lipid panel  3. Need for immunization against influenza Influenza vaccine administered Encouraged to take Tylenol as needed for fever or muscle aches. - Flu Vaccine QUAD High Dose(Fluad)  4. Encounter for immunization Comments: Pneumonia vaccine 23 given today - Pneumococcal polysaccharide vaccine 23-valent greater than or equal to 2yo subcutaneous/IM    Patient was given opportunity to ask questions. Patient verbalized understanding of the plan and was able to repeat key elements of the plan. All questions were answered to their satisfaction.  Eddie Brine, FNP   I, Eddie Brine, FNP, have reviewed all documentation for this visit. The documentation on 09/29/21 for the exam, diagnosis, procedures, and orders are all accurate and  complete.   IF YOU HAVE BEEN REFERRED TO A SPECIALIST, IT MAY TAKE 1-2 WEEKS TO SCHEDULE/PROCESS THE REFERRAL. IF YOU HAVE NOT HEARD FROM US/SPECIALIST IN TWO WEEKS, PLEASE GIVE [place] A CALL AT [phone].   THE PATIENT IS ENCOURAGED TO PRACTICE SOCIAL DISTANCING DUE TO THE COVID-19 PANDEMIC.

## 2021-09-29 NOTE — Patient Instructions (Signed)
Cooking With Less Salt Cooking with less salt is one way to reduce the amount of sodium you get from food. Sodium is one of the elements that make up salt. It is found naturally in foods and is also added to certain foods. Depending on your condition and overall health, your health care provider or dietitian may recommend that you reduce your sodium intake. Most people should have less than 2,300 milligrams (mg) of sodium each day. If you have high blood pressure (hypertension), you may need to limit your sodium to 1,500 mg each day. Follow the tipsbelow to help reduce your sodium intake. What are tips for eating less sodium? Reading food labels  Check the food label before buying or using packaged ingredients. Always check the label for the serving size and sodium content. Look for products with no more than 140 mg of sodium in one serving. Check the % Daily Value column to see what percent of the daily recommended amount of sodium is provided in one serving of the product. Foods with 5% or less in this column are considered low in sodium. Foods with 20% or higher are considered high in sodium. Do not choose foods with salt as one of the first three ingredients on the ingredients list. If salt is one of the first three ingredients, it usually means the item is high in sodium.  Shopping Buy sodium-free or low-sodium products. Look for the following words on food labels: Low-sodium. Sodium-free. Reduced-sodium. No salt added. Unsalted. Always check the sodium content even if foods are labeled as low-sodium or no salt added. Buy fresh foods. Cooking Use herbs, seasonings without salt, and spices as substitutes for salt. Use sodium-free baking soda when baking. Grill, braise, or roast foods to add flavor with less salt. Avoid adding salt to pasta, rice, or hot cereals. Drain and rinse canned vegetables, beans, and meat before use. Avoid adding salt when cooking sweets and desserts. Cook with  low-sodium ingredients. What foods are high in sodium? Vegetables Regular canned vegetables (not low-sodium or reduced-sodium). Sauerkraut, pickled vegetables, and relishes. Olives. French fries. Onion rings. Regular canned tomato sauce and paste. Regular tomato and vegetable juice. Frozenvegetables in sauces. Grains Instant hot cereals. Bread stuffing, pancake, and biscuit mixes. Croutons. Seasoned rice or pasta mixes. Noodle soup cups. Boxed or frozen macaroni and cheese. Regular salted crackers. Self-rising flour. Rolls. Bagels. Flourtortillas and wraps. Meats and other proteins Meat or fish that is salted, canned, smoked, cured, spiced, or pickled. This includes bacon, ham, sausages, hot dogs, corned beef, chipped beef, meat loaves, salt pork, jerky, pickled herring, anchovies, regular canned tuna, andsardines. Salted nuts. Dairy Processed cheese and cheese spreads. Cheese curds. Blue cheese. Feta cheese.String cheese. Regular cottage cheese. Buttermilk. Canned milk. The items listed above may not be a complete list of foods high in sodium. Actual amounts of sodium may be different depending on processing. Contact a dietitian for more information. What foods are low in sodium? Fruits Fresh, frozen, or canned fruit with no sauce added. Fruit juice. Vegetables Fresh or frozen vegetables with no sauce added. "No salt added" canned vegetables. "No salt added" tomato sauce and paste. Low-sodium orreduced-sodium tomato and vegetable juice. Grains Noodles, pasta, quinoa, rice. Shredded or puffed wheat or puffed rice. Regular or quick oats (not instant). Low-sodium crackers. Low-sodium bread. Whole-grainbread and whole-grain pasta. Unsalted popcorn. Meats and other proteins Fresh or frozen whole meats, poultry (not injected with sodium), and fish with no sauce added. Unsalted nuts. Dried peas, beans, and   lentils without added salt. Unsalted canned beans. Eggs. Unsalted nut butters. Low-sodium canned  tunaor chicken. Dairy Milk. Soy milk. Yogurt. Low-sodium cheeses, such as Swiss, Monterey Jack, mozzarella, and ricotta. Sherbet or ice cream (keep to  cup per serving).Cream cheese. Fats and oils Unsalted butter or margarine. Other foods Homemade pudding. Sodium-free baking soda and baking powder. Herbs and spices.Low-sodium seasoning mixes. Beverages Coffee and tea. Carbonated beverages. The items listed above may not be a complete list of foods low in sodium. Actual amounts of sodium may be different depending on processing. Contact a dietitian for more information. What are some salt alternatives when cooking? The following are herbs, seasonings, and spices that can be used instead of salt to flavor your food. Herbs should be fresh or dried. Do not choose packaged mixes. Next to the name of the herb, spice, or seasoning aresome examples of foods you can pair it with. Herbs [place] leaves - Soups, meat and vegetable dishes, and spaghetti sauce. Basil - Italian dishes, soups, pasta, and fish dishes. Cilantro - Meat, poultry, and vegetable dishes. Chili powder - Marinades and Mexican dishes. Chives - Salad dressings and potato dishes. Cumin - Mexican dishes, couscous, and meat dishes. Dill - Fish dishes, sauces, and salads. Fennel - Meat and vegetable dishes, breads, and cookies. Garlic (do not use garlic salt) - Italian dishes, meat dishes, salad dressings, and sauces. Marjoram - Soups, potato dishes, and meat dishes. Oregano - Pizza and spaghetti sauce. Parsley - Salads, soups, pasta, and meat dishes. Rosemary - Italian dishes, salad dressings, soups, and red meats. Saffron - Fish dishes, pasta, and some poultry dishes. Sage - Stuffings and sauces. Tarragon - Fish and poultry dishes. Thyme - Stuffing, meat, and fish dishes. Seasonings Lemon juice - Fish dishes, poultry dishes, vegetables, and salads. Vinegar - Salad dressings, vegetables, and fish dishes. Spices Cinnamon - Sweet  dishes, such as cakes, cookies, and puddings. Cloves - Gingerbread, puddings, and marinades for meats. Curry - Vegetable dishes, fish and poultry dishes, and stir-fry dishes. Ginger - Vegetable dishes, fish dishes, and stir-fry dishes. Nutmeg - Pasta, vegetables, poultry, fish dishes, and custard. Summary Cooking with less salt is one way to reduce the amount of sodium that you get from food. Buy sodium-free or low-sodium products. Check the food label before using or buying packaged ingredients. Use herbs, seasonings without salt, and spices as substitutes for salt in foods. This information is not intended to replace advice given to you by your health care provider. Make sure you discuss any questions you have with your healthcare provider. Document Revised: 10/18/2019 Document Reviewed: 10/18/2019 Elsevier Patient Education  2022 [employer]  

## 2021-09-30 LAB — BMP8+EGFR
BUN/Creatinine Ratio: 14 (ref 10–24)
BUN: 33 mg/dL — ABNORMAL HIGH (ref 8–27)
CO2: 24 mmol/L (ref 20–29)
Calcium: 10.1 mg/dL (ref 8.6–10.2)
Chloride: 103 mmol/L (ref 96–106)
Creatinine, Ser: 2.37 mg/dL — ABNORMAL HIGH (ref 0.76–1.27)
Glucose: 101 mg/dL — ABNORMAL HIGH (ref 70–99)
Potassium: 5.2 mmol/L (ref 3.5–5.2)
Sodium: 140 mmol/L (ref 134–144)
eGFR: 27 mL/min/{1.73_m2} — ABNORMAL LOW (ref >59)

## 2021-09-30 LAB — LIPID PANEL
Chol/HDL Ratio: 2.6 ratio (ref 0.0–5.0)
Cholesterol, Total: 130 mg/dL (ref 100–199)
HDL: 50 mg/dL (ref >39)
LDL Chol Calc (NIH): 68 mg/dL (ref 0–99)
Triglycerides: 57 mg/dL (ref 0–149)
VLDL Cholesterol Cal: 12 mg/dL (ref 5–40)

## 2021-10-26 ENCOUNTER — Other Ambulatory Visit: Payer: Self-pay | Admitting: Nurse Practitioner

## 2021-11-11 DIAGNOSIS — N1832 Chronic kidney disease, stage 3b: Secondary | ICD-10-CM | POA: Diagnosis not present

## 2021-11-18 DIAGNOSIS — D631 Anemia in chronic kidney disease: Secondary | ICD-10-CM | POA: Diagnosis not present

## 2021-11-18 DIAGNOSIS — I129 Hypertensive chronic kidney disease with stage 1 through stage 4 chronic kidney disease, or unspecified chronic kidney disease: Secondary | ICD-10-CM | POA: Diagnosis not present

## 2021-11-18 DIAGNOSIS — R7303 Prediabetes: Secondary | ICD-10-CM | POA: Diagnosis not present

## 2021-11-18 DIAGNOSIS — R809 Proteinuria, unspecified: Secondary | ICD-10-CM | POA: Diagnosis not present

## 2021-11-18 DIAGNOSIS — N1832 Chronic kidney disease, stage 3b: Secondary | ICD-10-CM | POA: Diagnosis not present

## 2021-11-18 DIAGNOSIS — E1122 Type 2 diabetes mellitus with diabetic chronic kidney disease: Secondary | ICD-10-CM | POA: Diagnosis not present

## 2021-11-18 DIAGNOSIS — E875 Hyperkalemia: Secondary | ICD-10-CM | POA: Diagnosis not present

## 2021-11-18 DIAGNOSIS — Z87442 Personal history of urinary calculi: Secondary | ICD-10-CM | POA: Diagnosis not present

## 2021-11-18 DIAGNOSIS — N400 Enlarged prostate without lower urinary tract symptoms: Secondary | ICD-10-CM | POA: Diagnosis not present

## 2021-11-19 DIAGNOSIS — R609 Edema, unspecified: Secondary | ICD-10-CM | POA: Diagnosis not present

## 2021-11-19 DIAGNOSIS — E785 Hyperlipidemia, unspecified: Secondary | ICD-10-CM | POA: Diagnosis not present

## 2021-11-19 DIAGNOSIS — D631 Anemia in chronic kidney disease: Secondary | ICD-10-CM | POA: Diagnosis not present

## 2021-11-19 DIAGNOSIS — N529 Male erectile dysfunction, unspecified: Secondary | ICD-10-CM | POA: Diagnosis not present

## 2021-11-19 DIAGNOSIS — N189 Chronic kidney disease, unspecified: Secondary | ICD-10-CM | POA: Diagnosis not present

## 2021-11-19 DIAGNOSIS — Z720 Tobacco use: Secondary | ICD-10-CM | POA: Diagnosis not present

## 2021-11-19 DIAGNOSIS — I129 Hypertensive chronic kidney disease with stage 1 through stage 4 chronic kidney disease, or unspecified chronic kidney disease: Secondary | ICD-10-CM | POA: Diagnosis not present

## 2021-11-26 ENCOUNTER — Encounter: Payer: Self-pay | Admitting: Nurse Practitioner

## 2021-12-12 ENCOUNTER — Other Ambulatory Visit: Payer: Self-pay | Admitting: Nurse Practitioner

## 2021-12-12 DIAGNOSIS — I129 Hypertensive chronic kidney disease with stage 1 through stage 4 chronic kidney disease, or unspecified chronic kidney disease: Secondary | ICD-10-CM

## 2021-12-12 DIAGNOSIS — N183 Chronic kidney disease, stage 3 unspecified: Secondary | ICD-10-CM

## 2022-01-23 ENCOUNTER — Ambulatory Visit: Payer: Medicare PPO | Admitting: Cardiovascular Disease

## 2022-01-29 ENCOUNTER — Other Ambulatory Visit: Payer: Self-pay | Admitting: Nurse Practitioner

## 2022-02-11 ENCOUNTER — Other Ambulatory Visit: Payer: Self-pay | Admitting: Nurse Practitioner

## 2022-02-26 ENCOUNTER — Ambulatory Visit (INDEPENDENT_AMBULATORY_CARE_PROVIDER_SITE_OTHER): Payer: Medicare PPO

## 2022-02-26 VITALS — BP 124/56 | HR 75 | Temp 98.0°F | Ht 68.8 in | Wt 179.8 lb

## 2022-02-26 DIAGNOSIS — Z0000 Encounter for general adult medical examination without abnormal findings: Secondary | ICD-10-CM

## 2022-02-26 NOTE — Progress Notes (Signed)
?This visit occurred during the SARS-CoV-2 public health emergency.  Safety protocols were in place, including screening questions prior to the visit, additional usage of staff PPE, and extensive cleaning of exam room while observing appropriate contact time as indicated for disinfecting solutions. ? ?Subjective:  ? Eddie Hutchinson is a 81 y.o. male who presents for Medicare Annual/Subsequent preventive examination. ? ?Review of Systems    ? ?Cardiac Risk Factors include: advanced age (>58men, >60 women);dyslipidemia;hypertension;male gender ? ?   ?Objective:  ?  ?Today's Vitals  ? 02/26/22 1054  ?BP: (!) 124/56  ?Pulse: 75  ?Temp: 98 ?F (36.7 ?C)  ?TempSrc: Oral  ?SpO2: 98%  ?Weight: 179 lb 12.8 oz (81.6 kg)  ?Height: 5' 8.8" (1.748 m)  ? ?Body mass index is 26.71 kg/m?. ? ? ?  02/26/2022  ? 11:01 AM 02/20/2021  ? 10:09 AM 02/17/2020  ? 10:55 AM 11/15/2019  ?  8:46 AM 05/30/2019  ?  9:26 AM 11/07/2018  ?  9:36 AM 11/07/2018  ?  9:09 AM  ?Advanced Directives  ?Does Patient Have a Medical Advance Directive? Yes No No No No  No  ?Type of Paramedic of [place];Living will        ?Copy of [place] in Chart? No - copy requested        ?Would patient like information on creating a medical advance directive?  No - Patient declined No - Patient declined No - Patient declined No - Patient declined Yes (MAU/Ambulatory/Procedural Areas - Information given) Yes (MAU/Ambulatory/Procedural Areas - Information given)  ? ? ?Current Medications (verified) ?Outpatient Encounter Medications as of 02/26/2022  ?Medication Sig  ? amLODipine (NORVASC) 10 MG tablet TAKE 1 TABLET BY MOUTH EVERY DAY  ? Ergocalciferol (VITAMIN D2 PO) Take 1,000 Units by mouth daily.   ? fenofibrate 160 MG tablet TAKE 1 TABLET BY MOUTH EVERY DAY  ? ferrous sulfate 325 (65 FE) MG tablet TAKE 1 TABLET BY MOUTH EVERY DAY  ? furosemide (LASIX) 20 MG tablet Take 2 tablets (40 mg total) by mouth daily.  ? hydrALAZINE  (APRESOLINE) 100 MG tablet Take 100 mg by mouth 3 (three) times daily.  ? isosorbide mononitrate (IMDUR) 30 MG 24 hr tablet Take 1 tablet by mouth daily.  ? ?No facility-administered encounter medications on file as of 02/26/2022.  ? ? ?Allergies (verified) ?Bee venom  ? ?History: ?Past Medical History:  ?Diagnosis Date  ? Abnormal EKG 11/24/2016  ? LAFB with LVH and QRS widening  ? Benign essential HTN 11/24/2016  ? Family history of early CAD 11/24/2016  ? Glucose intolerance (impaired glucose tolerance)   ? Hyperlipidemia   ? Hyperlipidemia 11/24/2016  ? Hypertension   ? Kidney stone   ? PVC's (premature ventricular contractions) 11/24/2016  ? Vitamin D deficiency   ? ?Past Surgical History:  ?Procedure Laterality Date  ? CYSTOSCOPY/RETROGRADE/URETEROSCOPY/STONE EXTRACTION WITH BASKET    ? KIDNEY STONE SURGERY    ? LITHOTRIPSY    ? TOOTH EXTRACTION Left 01/2021  ? ?Family History  ?Problem Relation Age of Onset  ? Heart failure Mother   ? Heart attack Father 42  ? Heart disease Father   ? CVA Brother   ? Cancer Brother   ? ?Social History  ? ?Socioeconomic History  ? Marital status: Widowed  ?  Spouse name: Not on file  ? Number of children: Not on file  ? Years of education: Not on file  ? Highest education level: Not on  file  ?Occupational History  ? Occupation: retired  ?Tobacco Use  ? Smoking status: Former  ? Smokeless tobacco: Never  ?Vaping Use  ? Vaping Use: Never used  ?Substance and Sexual Activity  ? Alcohol use: Yes  ?  Comment: occassionally  ? Drug use: No  ? Sexual activity: Not Currently  ?Other Topics Concern  ? Not on file  ?Social History Narrative  ? Not on file  ? ?Social Determinants of Health  ? ?Financial Resource Strain: Low Risk   ? Difficulty of Paying Living Expenses: Not hard at all  ?Food Insecurity: No Food Insecurity  ? Worried About Programme researcher, broadcasting/film/video in the Last Year: Never true  ? Ran Out of Food in the Last Year: Never true  ?Transportation Needs: No Transportation Needs  ? Lack of  Transportation (Medical): No  ? Lack of Transportation (Non-Medical): No  ?Physical Activity: Inactive  ? Days of Exercise per Week: 0 days  ? Minutes of Exercise per Session: 0 min  ?Stress: No Stress Concern Present  ? Feeling of Stress : Not at all  ?Social Connections: Not on file  ? ? ?Tobacco Counseling ?Counseling given: Not Answered ? ? ?Clinical Intake: ? ?Pre-visit preparation completed: Yes ? ?Pain : No/denies pain ? ?  ? ?Nutritional Status: BMI 25 -29 Overweight ?Nutritional Risks: None ?Diabetes: No ? ?How often do you need to have someone help you when you read instructions, pamphlets, or other written materials from your doctor or pharmacy?: 1 - Never ?What is the last grade level you completed in school?: 12th grade ? ?Diabetic? no ? ?Interpreter Needed?: No ? ?Information entered by :: NAllen LPN ? ? ?Activities of Daily Living ? ?  02/26/2022  ? 11:01 AM  ?In your present state of health, do you have any difficulty performing the following activities:  ?Hearing? 0  ?Vision? 0  ?Difficulty concentrating or making decisions? 0  ?Walking or climbing stairs? 0  ?Dressing or bathing? 0  ?Doing errands, shopping? 0  ?Preparing Food and eating ? N  ?Using the Toilet? N  ?In the past six months, have you accidently leaked urine? N  ?Do you have problems with loss of bowel control? N  ?Managing your Medications? N  ?Managing your Finances? N  ?Housekeeping or managing your Housekeeping? N  ? ? ?Patient Care Team: ?Arnette Felts, FNP as PCP - General (General Practice) ? ?Indicate any recent Medical Services you may have received from other than [facility] providers in the past year (date may be approximate). ? ?   ?Assessment:  ? This is a routine wellness examination for Eddie Hutchinson. ? ?Hearing/Vision screen ?Vision Screening - Comments:: No regular eye exams, ? ?Dietary issues and exercise activities discussed: ?Current Exercise Habits: The patient does not participate in regular exercise at present ? ? Goals  Addressed   ? ?  ?  ?  ?  ? This Visit's Progress  ?  Patient Stated     ?  02/26/2022, stay healthy ?  ? ?  ? ?Depression Screen ? ?  02/26/2022  ? 11:01 AM 02/20/2021  ? 10:10 AM 11/15/2019  ?  8:47 AM 08/09/2019  ?  9:00 AM 05/30/2019  ?  9:27 AM 05/08/2019  ?  8:49 AM 11/07/2018  ?  8:46 AM  ?PHQ 2/9 Scores  ?PHQ - 2 Score 0 0 2 1 2  0 0  ?PHQ- 9 Score   2  2    ?  ?Fall Risk ? ?  02/26/2022  ? 11:01 AM 02/20/2021  ? 10:10 AM 11/15/2019  ?  8:47 AM 08/09/2019  ?  9:00 AM 05/30/2019  ?  9:27 AM  ?Fall Risk   ?Falls in the past year? 0 0 0 0 0  ?Number falls in past yr: 0    0  ?Injury with Fall? 0      ?Risk for fall due to : Medication side effect Medication side effect Medication side effect  Medication side effect  ?Follow up Falls evaluation completed;Education provided;Falls prevention discussed Falls evaluation completed;Education provided;Falls prevention discussed Education provided;Falls evaluation completed;Falls prevention discussed  Falls evaluation completed;Education provided;Falls prevention discussed  ? ? ?FALL RISK PREVENTION PERTAINING TO THE HOME: ? ?Any stairs in or around the home? No  ?If so, are there any without handrails?  N/a ?Home free of loose throw rugs in walkways, pet beds, electrical cords, etc? Yes  ?Adequate lighting in your home to reduce risk of falls? Yes  ? ?ASSISTIVE DEVICES UTILIZED TO PREVENT FALLS: ? ?Life alert? No  ?Use of a cane, walker or w/c? No  ?Grab bars in the bathroom? Yes  ?Shower chair or bench in shower? No  ?Elevated toilet seat or a handicapped toilet? No  ? ?TIMED UP AND GO: ? ?Was the test performed? No .  ? ? ?Gait steady and fast without use of assistive device ? ?Cognitive Function: ?  ?  ? ?  02/26/2022  ? 11:02 AM 02/20/2021  ? 10:11 AM 11/15/2019  ?  8:50 AM 05/30/2019  ?  9:30 AM  ?6CIT Screen  ?What Year? 0 points 0 points 0 points 0 points  ?What month? 0 points 0 points 0 points 0 points  ?What time? 0 points 0 points 0 points 3 points  ?Count back from 20 0 points  0 points 0 points 0 points  ?Months in reverse 0 points 0 points 0 points 0 points  ?Repeat phrase 0 points 2 points 0 points 0 points  ?Total Score 0 points 2 points 0 points 3 points  ? ? ?Immunizations ?Immunizat

## 2022-02-26 NOTE — Patient Instructions (Signed)
Eddie Hutchinson , ?Thank you for taking time to come for your Medicare Wellness Visit. I appreciate your ongoing commitment to your health goals. Please review the following plan we discussed and let me know if I can assist you in the future.  ? ?Screening recommendations/referrals: ?Colonoscopy: not required ?Recommended yearly ophthalmology/optometry visit for glaucoma screening and checkup ?Recommended yearly dental visit for hygiene and checkup ? ?Vaccinations: ?Influenza vaccine: due 06/09/2022 ?Pneumococcal vaccine: completed 09/29/2021 ?Tdap vaccine: completed 05/18/2019, due 05/17/2029 ?Shingles vaccine: completed ?Covid-19:  10/22/2020, 02/07/2020, 01/12/2020 ? ?Advanced directives: Please bring a copy of your POA (Power of Attorney) and/or Living Will to your next appointment.  ? ?Conditions/risks identified: none ? ?Next appointment: Follow up in one year for your annual wellness visit.  ? ?Preventive Care 17 Years and Older, Male ?Preventive care refers to lifestyle choices and visits with your health care provider that can promote health and wellness. ?What does preventive care include? ?A yearly physical exam. This is also called an annual well check. ?Dental exams once or twice a year. ?Routine eye exams. Ask your health care provider how often you should have your eyes checked. ?Personal lifestyle choices, including: ?Daily care of your teeth and gums. ?Regular physical activity. ?Eating a healthy diet. ?Avoiding tobacco and drug use. ?Limiting alcohol use. ?Practicing safe sex. ?Taking low doses of aspirin every day. ?Taking vitamin and mineral supplements as recommended by your health care provider. ?What happens during an annual well check? ?The services and screenings done by your health care provider during your annual well check will depend on your age, overall health, lifestyle risk factors, and family history of disease. ?Counseling  ?Your health care provider may ask you questions about your: ?Alcohol  use. ?Tobacco use. ?Drug use. ?Emotional well-being. ?Home and relationship well-being. ?Sexual activity. ?Eating habits. ?History of falls. ?Memory and ability to understand (cognition). ?Work and work Statistician. ?Screening  ?You may have the following tests or measurements: ?Height, weight, and BMI. ?Blood pressure. ?Lipid and cholesterol levels. These may be checked every 5 years, or more frequently if you are over 70 years old. ?Skin check. ?Lung cancer screening. You may have this screening every year starting at age 45 if you have a 30-pack-year history of smoking and currently smoke or have quit within the past 15 years. ?Fecal occult blood test (FOBT) of the stool. You may have this test every year starting at age 33. ?Flexible sigmoidoscopy or colonoscopy. You may have a sigmoidoscopy every 5 years or a colonoscopy every 10 years starting at age 10. ?Prostate cancer screening. Recommendations will vary depending on your family history and other risks. ?Hepatitis C blood test. ?Hepatitis B blood test. ?Sexually transmitted disease (STD) testing. ?Diabetes screening. This is done by checking your blood sugar (glucose) after you have not eaten for a while (fasting). You may have this done every 1-3 years. ?Abdominal aortic aneurysm (AAA) screening. You may need this if you are a current or former smoker. ?Osteoporosis. You may be screened starting at age 70 if you are at high risk. ?Talk with your health care provider about your test results, treatment options, and if necessary, the need for more tests. ?Vaccines  ?Your health care provider may recommend certain vaccines, such as: ?Influenza vaccine. This is recommended every year. ?Tetanus, diphtheria, and acellular pertussis (Tdap, Td) vaccine. You may need a Td booster every 10 years. ?Zoster vaccine. You may need this after age 47. ?Pneumococcal 13-valent conjugate (PCV13) vaccine. One dose is recommended after age  65. ?Pneumococcal polysaccharide  (PPSV23) vaccine. One dose is recommended after age 72. ?Talk to your health care provider about which screenings and vaccines you need and how often you need them. ?This information is not intended to replace advice given to you by your health care provider. Make sure you discuss any questions you have with your health care provider. ?Document Released: 11/22/2015 Document Revised: 07/15/2016 Document Reviewed: 08/27/2015 ?Elsevier Interactive Patient Education ? 2017 [place]. ? ?Fall Prevention in the Home ?Falls can cause injuries. They can happen to people of all ages. There are many things you can do to make your home safe and to help prevent falls. ?What can I do on the outside of my home? ?Regularly fix the edges of walkways and driveways and fix any cracks. ?Remove anything that might make you trip as you walk through a door, such as a raised step or threshold. ?Trim any bushes or trees on the path to your home. ?Use bright outdoor lighting. ?Clear any walking paths of anything that might make someone trip, such as rocks or tools. ?Regularly check to see if handrails are loose or broken. Make sure that both sides of any steps have handrails. ?Any raised decks and porches should have guardrails on the edges. ?Have any leaves, snow, or ice cleared regularly. ?Use sand or salt on walking paths during winter. ?Clean up any spills in your garage right away. This includes oil or grease spills. ?What can I do in the bathroom? ?Use night lights. ?Install grab bars by the toilet and in the tub and shower. Do not use towel bars as grab bars. ?Use non-skid mats or decals in the tub or shower. ?If you need to sit down in the shower, use a plastic, non-slip stool. ?Keep the floor dry. Clean up any water that spills on the floor as soon as it happens. ?Remove soap buildup in the tub or shower regularly. ?Attach bath mats securely with double-sided non-slip rug tape. ?Do not have throw rugs and other things on the  floor that can make you trip. ?What can I do in the bedroom? ?Use night lights. ?Make sure that you have a light by your bed that is easy to reach. ?Do not use any sheets or blankets that are too big for your bed. They should not hang down onto the floor. ?Have a firm chair that has side arms. You can use this for support while you get dressed. ?Do not have throw rugs and other things on the floor that can make you trip. ?What can I do in the kitchen? ?Clean up any spills right away. ?Avoid walking on wet floors. ?Keep items that you use a lot in easy-to-reach places. ?If you need to reach something above you, use a strong step stool that has a grab bar. ?Keep electrical cords out of the way. ?Do not use floor polish or wax that makes floors slippery. If you must use wax, use non-skid floor wax. ?Do not have throw rugs and other things on the floor that can make you trip. ?What can I do with my stairs? ?Do not leave any items on the stairs. ?Make sure that there are handrails on both sides of the stairs and use them. Fix handrails that are broken or loose. Make sure that handrails are as long as the stairways. ?Check any carpeting to make sure that it is firmly attached to the stairs. Fix any carpet that is loose or worn. ?Avoid having throw rugs at  the top or bottom of the stairs. If you do have throw rugs, attach them to the floor with carpet tape. ?Make sure that you have a light switch at the top of the stairs and the bottom of the stairs. If you do not have them, ask someone to add them for you. ?What else can I do to help prevent falls? ?Wear shoes that: ?Do not have high heels. ?Have rubber bottoms. ?Are comfortable and fit you well. ?Are closed at the toe. Do not wear sandals. ?If you use a stepladder: ?Make sure that it is fully opened. Do not climb a closed stepladder. ?Make sure that both sides of the stepladder are locked into place. ?Ask someone to hold it for you, if possible. ?Clearly mark and make  sure that you can see: ?Any grab bars or handrails. ?First and last steps. ?Where the edge of each step is. ?Use tools that help you move around (mobility aids) if they are needed. These include: ?Canes. ?Walkers.

## 2022-03-02 DIAGNOSIS — N1832 Chronic kidney disease, stage 3b: Secondary | ICD-10-CM | POA: Diagnosis not present

## 2022-03-31 ENCOUNTER — Ambulatory Visit: Payer: Medicare PPO | Admitting: Nurse Practitioner

## 2022-03-31 DIAGNOSIS — E875 Hyperkalemia: Secondary | ICD-10-CM | POA: Diagnosis not present

## 2022-03-31 DIAGNOSIS — Z87442 Personal history of urinary calculi: Secondary | ICD-10-CM | POA: Diagnosis not present

## 2022-03-31 DIAGNOSIS — I129 Hypertensive chronic kidney disease with stage 1 through stage 4 chronic kidney disease, or unspecified chronic kidney disease: Secondary | ICD-10-CM | POA: Diagnosis not present

## 2022-03-31 DIAGNOSIS — E1122 Type 2 diabetes mellitus with diabetic chronic kidney disease: Secondary | ICD-10-CM | POA: Diagnosis not present

## 2022-03-31 DIAGNOSIS — N400 Enlarged prostate without lower urinary tract symptoms: Secondary | ICD-10-CM | POA: Diagnosis not present

## 2022-03-31 DIAGNOSIS — R809 Proteinuria, unspecified: Secondary | ICD-10-CM | POA: Diagnosis not present

## 2022-03-31 DIAGNOSIS — R7303 Prediabetes: Secondary | ICD-10-CM | POA: Diagnosis not present

## 2022-03-31 DIAGNOSIS — N184 Chronic kidney disease, stage 4 (severe): Secondary | ICD-10-CM | POA: Diagnosis not present

## 2022-03-31 DIAGNOSIS — D631 Anemia in chronic kidney disease: Secondary | ICD-10-CM | POA: Diagnosis not present

## 2022-04-09 ENCOUNTER — Ambulatory Visit: Payer: Medicare PPO | Admitting: Nurse Practitioner

## 2022-04-09 VITALS — BP 132/70 | HR 74 | Temp 98.1°F | Ht 68.8 in | Wt 177.0 lb

## 2022-04-09 DIAGNOSIS — I129 Hypertensive chronic kidney disease with stage 1 through stage 4 chronic kidney disease, or unspecified chronic kidney disease: Secondary | ICD-10-CM | POA: Diagnosis not present

## 2022-04-09 DIAGNOSIS — R21 Rash and other nonspecific skin eruption: Secondary | ICD-10-CM | POA: Diagnosis not present

## 2022-04-09 DIAGNOSIS — R7309 Other abnormal glucose: Secondary | ICD-10-CM | POA: Diagnosis not present

## 2022-04-09 DIAGNOSIS — E782 Mixed hyperlipidemia: Secondary | ICD-10-CM | POA: Diagnosis not present

## 2022-04-09 DIAGNOSIS — N183 Chronic kidney disease, stage 3 unspecified: Secondary | ICD-10-CM | POA: Diagnosis not present

## 2022-04-09 MED ORDER — SHINGRIX 50 MCG/0.5ML IM SUSR: 0.5000 mL | Freq: Once | INTRAMUSCULAR | 0 refills | Status: AC

## 2022-04-09 MED ORDER — TRIAMCINOLONE ACETONIDE 0.5 % EX OINT: 1.0000 "application " | TOPICAL_OINTMENT | Freq: Two times a day (BID) | CUTANEOUS | 2 refills | Status: DC

## 2022-04-09 NOTE — Progress Notes (Signed)
I,Eddie Hutchinson,acting as a Education administrator for [employer], FNP.,have documented all relevant documentation on the behalf of Eddie Brine, FNP,as directed by  Eddie Brine, FNP while in the presence of Eddie Hutchinson, Eddie Hutchinson.  This visit occurred during the SARS-CoV-2 public health emergency.  Safety protocols were in place, including screening questions prior to the visit, additional usage of staff PPE, and extensive cleaning of exam room while observing appropriate contact time as indicated for disinfecting solutions.  Subjective:     Patient ID: Eddie Hutchinson , male    DOB: 04-08-1941 , 81 y.o.   MRN: [id number]   Chief Complaint  Patient presents with   Hypertension    HPI  Presents today for his HTN follow up.  He has seen Dr. Harrie Jeans - Nephrologist one week ago.   Hypertension This is a chronic problem. The current episode started more than 1 year ago. The problem has been gradually improving since onset. The problem is controlled. Pertinent negatives include no blurred vision, chest pain, headaches, palpitations, peripheral edema or shortness of breath. There are no associated agents to hypertension. Risk factors for coronary artery disease include male gender and family history. Past treatments include alpha 1 blockers and calcium channel blockers. The current treatment provides mild improvement. There are no compliance problems.  Hypertensive end-organ damage includes CAD/MI. There is no history of CVA or left ventricular hypertrophy. Identifiable causes of hypertension include chronic renal disease (stage 2-3 kidney ).     Past Medical History:  Diagnosis Date   Abnormal EKG 11/24/2016   LAFB with LVH and QRS widening   Benign essential HTN 11/24/2016   Family history of early CAD 11/24/2016   Glucose intolerance (impaired glucose tolerance)    Hyperlipidemia    Hyperlipidemia 11/24/2016   Hypertension    Kidney stone    PVC's (premature ventricular contractions) 11/24/2016    Vitamin D deficiency      Family History  Problem Relation Age of Onset   Heart failure Mother    Heart attack Father 69   Heart disease Father    CVA Brother    Cancer Brother      Current Outpatient Medications:    triamcinolone ointment (KENALOG) 0.5 %, Apply 1 application. topically 2 (two) times daily., Disp: 30 g, Rfl: 2   amLODipine (NORVASC) 10 MG tablet, TAKE 1 TABLET BY MOUTH EVERY DAY (Patient taking differently: Take 10 mg by mouth daily.), Disp: 90 tablet, Rfl: 1   Ergocalciferol (VITAMIN D2 PO), Take 1,000 Units by mouth daily. , Disp: , Rfl:    fenofibrate 160 MG tablet, TAKE 1 TABLET BY MOUTH EVERY DAY (Patient taking differently: Take 160 mg by mouth daily.), Disp: 90 tablet, Rfl: 0   ferrous sulfate 325 (65 FE) MG tablet, TAKE 1 TABLET BY MOUTH EVERY DAY (Patient taking differently: Take 325 mg by mouth daily.), Disp: 90 tablet, Rfl: 1   furosemide (LASIX) 20 MG tablet, Take 2 tablets (40 mg total) by mouth daily., Disp: 180 tablet, Rfl: 3   hydrALAZINE (APRESOLINE) 100 MG tablet, Take 100 mg by mouth 3 (three) times daily., Disp: , Rfl:    isosorbide mononitrate (IMDUR) 30 MG 24 hr tablet, Take 1 tablet by mouth daily., Disp: , Rfl:    losartan (COZAAR) 25 MG tablet, Take 25 mg by mouth daily., Disp: , Rfl:    predniSONE (DELTASONE) 20 MG tablet, Take 2 tablets (40 mg total) by mouth daily with breakfast. For the next four days, Disp: 8  tablet, Rfl: 0   Allergies  Allergen Reactions   Bee Venom Hives    As a child      Review of Systems  Constitutional: Negative.   Eyes:  Negative for blurred vision.  Respiratory: Negative.  Negative for shortness of breath.   Cardiovascular: Negative.  Negative for chest pain and palpitations.  Gastrointestinal: Negative.   Skin:  Positive for rash (bilateral legs to posterior area x 1 month).  Neurological: Negative.  Negative for headaches.     Today's Vitals   04/09/22 1209  BP: 132/70  Pulse: 74  Temp: 98.1 F  (36.7 C)  TempSrc: Oral  Weight: 177 lb (80.3 kg)  Height: 5' 8.8" (1.748 m)   Body mass index is 26.29 kg/m.   Objective:  Physical Exam Constitutional:      General: He is not in acute distress.    Appearance: Normal appearance.  Cardiovascular:     Rate and Rhythm: Normal rate and regular rhythm.     Pulses: Normal pulses.     Heart sounds: Normal heart sounds. No murmur heard. Pulmonary:     Effort: Pulmonary effort is normal. No respiratory distress.     Breath sounds: Normal breath sounds. No wheezing.  Skin:    Capillary Refill: Capillary refill takes less than 2 seconds.  Neurological:     General: No focal deficit present.     Mental Status: He is alert and oriented to person, place, and time.     Cranial Nerves: No cranial nerve deficit.     Motor: No weakness.  Psychiatric:        Mood and Affect: Mood normal.        Behavior: Behavior normal.        Thought Content: Thought content normal.        Judgment: Judgment normal.         Assessment And Plan:     1. Benign hypertension with CKD (chronic kidney disease) stage III (HCC) Comments: Blood pressure is fairly controlled, continue current medications and follow up with Nephrology for your chronic kidney disease  2. Mixed hyperlipidemia Comments: Stable, continue current medications. Tolerating well.  3. Abnormal glucose Comments: Stable, diet controlled  4. Rash and nonspecific skin eruption Comments: Rash to posterior legs, scaly. Will treat with steroid cream - triamcinolone ointment (KENALOG) 0.5 %; Apply 1 application. topically 2 (two) times daily.  Dispense: 30 g; Refill: 2     Patient was given opportunity to ask questions. Patient verbalized understanding of the plan and was able to repeat key elements of the plan. All questions were answered to their satisfaction.  Eddie Brine, FNP   I, Eddie Brine, FNP, have reviewed all documentation for this visit. The documentation on 04/09/22 for  the exam, diagnosis, procedures, and orders are all accurate and complete.   IF YOU HAVE BEEN REFERRED TO A SPECIALIST, IT MAY TAKE 1-2 WEEKS TO SCHEDULE/PROCESS THE REFERRAL. IF YOU HAVE NOT HEARD FROM US/SPECIALIST IN TWO WEEKS, PLEASE GIVE [place] A CALL AT [phone].   THE PATIENT IS ENCOURAGED TO PRACTICE SOCIAL DISTANCING DUE TO THE COVID-19 PANDEMIC.

## 2022-04-09 NOTE — Patient Instructions (Signed)
Hypertension, Adult High blood pressure (hypertension) is when the force of blood pumping through the arteries is too strong. The arteries are the blood vessels that carry blood from the heart throughout the body. Hypertension forces the heart to work harder to pump blood and may cause arteries to become narrow or stiff. Untreated or uncontrolled hypertension can lead to a heart attack, heart failure, a stroke, kidney disease, and other problems. A blood pressure reading consists of a higher number over a lower number. Ideally, your blood pressure should be below 120/80. The first ("top") number is called the systolic pressure. It is a measure of the pressure in your arteries as your heart beats. The second ("bottom") number is called the diastolic pressure. It is a measure of the pressure in your arteries as the heart relaxes. What are the causes? The exact cause of this condition is not known. There are some conditions that result in high blood pressure. What increases the risk? Certain factors may make you more likely to develop high blood pressure. Some of these risk factors are under your control, including: Smoking. Not getting enough exercise or physical activity. Being overweight. Having too much fat, sugar, calories, or salt (sodium) in your diet. Drinking too much alcohol. Other risk factors include: Having a personal history of heart disease, diabetes, high cholesterol, or kidney disease. Stress. Having a family history of high blood pressure and high cholesterol. Having obstructive sleep apnea. Age. The risk increases with age. What are the signs or symptoms? High blood pressure may not cause symptoms. Very high blood pressure (hypertensive crisis) may cause: Headache. Fast or irregular heartbeats (palpitations). Shortness of breath. Nosebleed. Nausea and vomiting. Vision changes. Severe chest pain, dizziness, and seizures. How is this diagnosed? This condition is diagnosed by  measuring your blood pressure while you are seated, with your arm resting on a flat surface, your legs uncrossed, and your feet flat on the floor. The cuff of the blood pressure monitor will be placed directly against the skin of your upper arm at the level of your heart. Blood pressure should be measured at least twice using the same arm. Certain conditions can cause a difference in blood pressure between your right and left arms. If you have a high blood pressure reading during one visit or you have normal blood pressure with other risk factors, you may be asked to: Return on a different day to have your blood pressure checked again. Monitor your blood pressure at home for 1 week or longer. If you are diagnosed with hypertension, you may have other blood or imaging tests to help your health care provider understand your overall risk for other conditions. How is this treated? This condition is treated by making healthy lifestyle changes, such as eating healthy foods, exercising more, and reducing your alcohol intake. You may be referred for counseling on a healthy diet and physical activity. Your health care provider may prescribe medicine if lifestyle changes are not enough to get your blood pressure under control and if: Your systolic blood pressure is above 130. Your diastolic blood pressure is above 80. Your personal target blood pressure may vary depending on your medical conditions, your age, and other factors. Follow these instructions at home: Eating and drinking  Eat a diet that is high in fiber and potassium, and low in sodium, added sugar, and fat. An example of this eating plan is called the DASH diet. DASH stands for Dietary Approaches to Stop Hypertension. To eat this way: Eat   plenty of fresh fruits and vegetables. Try to fill one half of your plate at each meal with fruits and vegetables. Eat whole grains, such as whole-wheat pasta, brown rice, or whole-grain bread. Fill about one  fourth of your plate with whole grains. Eat or drink low-fat dairy products, such as skim milk or low-fat yogurt. Avoid fatty cuts of meat, processed or cured meats, and poultry with skin. Fill about one fourth of your plate with lean proteins, such as fish, chicken without skin, beans, eggs, or tofu. Avoid pre-made and processed foods. These tend to be higher in sodium, added sugar, and fat. Reduce your daily sodium intake. Many people with hypertension should eat less than 1,500 mg of sodium a day. Do not drink alcohol if: Your health care provider tells you not to drink. You are pregnant, may be pregnant, or are planning to become pregnant. If you drink alcohol: Limit how much you have to: 0-1 drink a day for women. 0-2 drinks a day for men. Know how much alcohol is in your drink. In the [place] one drink equals one 12 oz bottle of beer (355 mL), one 5 oz glass of wine (148 mL), or one 1 oz glass of hard liquor (44 mL). Lifestyle  Work with your health care provider to maintain a healthy body weight or to lose weight. Ask what an ideal weight is for you. Get at least 30 minutes of exercise that causes your heart to beat faster (aerobic exercise) most days of the week. Activities may include walking, swimming, or biking. Include exercise to strengthen your muscles (resistance exercise), such as Pilates or lifting weights, as part of your weekly exercise routine. Try to do these types of exercises for 30 minutes at least 3 days a week. Do not use any products that contain nicotine or tobacco. These products include cigarettes, chewing tobacco, and vaping devices, such as e-cigarettes. If you need help quitting, ask your health care provider. Monitor your blood pressure at home as told by your health care provider. Keep all follow-up visits. This is important. Medicines Take over-the-counter and prescription medicines only as told by your health care provider. Follow directions carefully. Blood  pressure medicines must be taken as prescribed. Do not skip doses of blood pressure medicine. Doing this puts you at risk for problems and can make the medicine less effective. Ask your health care provider about side effects or reactions to medicines that you should watch for. Contact a health care provider if you: Think you are having a reaction to a medicine you are taking. Have headaches that keep coming back (recurring). Feel dizzy. Have swelling in your ankles. Have trouble with your vision. Get help right away if you: Develop a severe headache or confusion. Have unusual weakness or numbness. Feel faint. Have severe pain in your chest or abdomen. Vomit repeatedly. Have trouble breathing. These symptoms may be an emergency. Get help right away. Call 911. Do not wait to see if the symptoms will go away. Do not drive yourself to the hospital. Summary Hypertension is when the force of blood pumping through your arteries is too strong. If this condition is not controlled, it may put you at risk for serious complications. Your personal target blood pressure may vary depending on your medical conditions, your age, and other factors. For most people, a normal blood pressure is less than 120/80. Hypertension is treated with lifestyle changes, medicines, or a combination of both. Lifestyle changes include losing weight, eating a healthy,   low-sodium diet, exercising more, and limiting alcohol. This information is not intended to replace advice given to you by your health care provider. Make sure you discuss any questions you have with your health care provider. Document Revised: 09/02/2021 Document Reviewed: 09/02/2021 Elsevier Patient Education  2023 [employer].  

## 2022-04-12 ENCOUNTER — Emergency Department (HOSPITAL_COMMUNITY): Payer: Medicare PPO

## 2022-04-12 ENCOUNTER — Other Ambulatory Visit: Payer: Self-pay

## 2022-04-12 ENCOUNTER — Ambulatory Visit (HOSPITAL_COMMUNITY)
Admission: EM | Admit: 2022-04-12 | Discharge: 2022-04-12 | Disposition: A | Discharge status: Home / Self Care | Payer: Medicare PPO

## 2022-04-12 ENCOUNTER — Encounter (HOSPITAL_COMMUNITY): Payer: Self-pay | Admitting: Emergency Medicine

## 2022-04-12 ENCOUNTER — Emergency Department (HOSPITAL_COMMUNITY)
Admission: EM | Admit: 2022-04-12 | Discharge: 2022-04-12 | Disposition: A | Discharge status: Home / Self Care | Payer: Medicare PPO | Attending: Emergency Medicine | Admitting: Emergency Medicine

## 2022-04-12 DIAGNOSIS — I129 Hypertensive chronic kidney disease with stage 1 through stage 4 chronic kidney disease, or unspecified chronic kidney disease: Secondary | ICD-10-CM | POA: Diagnosis not present

## 2022-04-12 DIAGNOSIS — R0789 Other chest pain: Secondary | ICD-10-CM | POA: Insufficient documentation

## 2022-04-12 DIAGNOSIS — N184 Chronic kidney disease, stage 4 (severe): Secondary | ICD-10-CM | POA: Insufficient documentation

## 2022-04-12 DIAGNOSIS — R079 Chest pain, unspecified: Secondary | ICD-10-CM | POA: Diagnosis not present

## 2022-04-12 DIAGNOSIS — Z79899 Other long term (current) drug therapy: Secondary | ICD-10-CM | POA: Insufficient documentation

## 2022-04-12 LAB — CBC
HCT: 35.6 % — ABNORMAL LOW (ref 39.0–52.0)
Hemoglobin: 11.5 g/dL — ABNORMAL LOW (ref 13.0–17.0)
MCH: 29.1 pg (ref 26.0–34.0)
MCHC: 32.3 g/dL (ref 30.0–36.0)
MCV: 90.1 fL (ref 80.0–100.0)
Platelets: 524 10*3/uL — ABNORMAL HIGH (ref 150–400)
RBC: 3.95 MIL/uL — ABNORMAL LOW (ref 4.22–5.81)
RDW: 16.3 % — ABNORMAL HIGH (ref 11.5–15.5)
WBC: 15.9 10*3/uL — ABNORMAL HIGH (ref 4.0–10.5)
nRBC: 0 % (ref 0.0–0.2)

## 2022-04-12 LAB — BASIC METABOLIC PANEL
Anion gap: 9 (ref 5–15)
BUN: 35 mg/dL — ABNORMAL HIGH (ref 8–23)
CO2: 22 mmol/L (ref 22–32)
Calcium: 9.1 mg/dL (ref 8.9–10.3)
Chloride: 107 mmol/L (ref 98–111)
Creatinine, Ser: 2.25 mg/dL — ABNORMAL HIGH (ref 0.61–1.24)
GFR, Estimated: 29 mL/min — ABNORMAL LOW (ref >60)
Glucose, Bld: 110 mg/dL — ABNORMAL HIGH (ref 70–99)
Potassium: 3.9 mmol/L (ref 3.5–5.1)
Sodium: 138 mmol/L (ref 135–145)

## 2022-04-12 LAB — TROPONIN I (HIGH SENSITIVITY)
Troponin I (High Sensitivity): 12 ng/L (ref <18)
Troponin I (High Sensitivity): 14 ng/L (ref <18)

## 2022-04-12 MED ORDER — MORPHINE SULFATE (PF) 4 MG/ML IV SOLN
4.0000 mg | Freq: Once | INTRAVENOUS | Status: AC
  Administered 2022-04-12: 4 mg via INTRAVENOUS
  Filled 2022-04-12: qty 1

## 2022-04-12 MED ORDER — PREDNISONE 20 MG PO TABS: 40.0000 mg | ORAL_TABLET | Freq: Every day | ORAL | 0 refills | Status: DC

## 2022-04-12 MED ORDER — DEXAMETHASONE SODIUM PHOSPHATE 10 MG/ML IJ SOLN
10.0000 mg | Freq: Once | INTRAMUSCULAR | Status: AC
Start: 2022-04-12 — End: 2022-04-12
  Administered 2022-04-12: 10 mg via INTRAVENOUS
  Filled 2022-04-12: qty 1

## 2022-04-12 NOTE — ED Triage Notes (Signed)
Pt c/o left sided chest pains that radiate to center of chest that started last night. Pt is worse when takes a deep breath.

## 2022-04-12 NOTE — ED Provider Notes (Signed)
[facility] EMERGENCY DEPARTMENT Provider Note   CSN: [id number] Arrival date & time: 04/12/22  1041     History  Chief Complaint  Patient presents with   Chest Pain    Eddie Hutchinson is a 81 y.o. male.  HPI Patient presents with chest pain.  Pain began yesterday after he was playing golf.  Since that time pain has been persistent, sternal, worse with moving his torso.  No syncope, dyspnea, the pain may be worse with deep breathing.  No history of cardiac disease, but he does have multiple other medical problems including CKD stage IV, hypertension.    Home Medications Prior to Admission medications   Medication Sig Start Date End Date Taking? Authorizing Provider  predniSONE (DELTASONE) 20 MG tablet Take 2 tablets (40 mg total) by mouth daily with breakfast. For the next four days 04/12/22  Yes Gerhard Munch, MD  amLODipine (NORVASC) 10 MG tablet TAKE 1 TABLET BY MOUTH EVERY DAY Patient taking differently: Take 10 mg by mouth daily. 12/12/21   Arnette Felts, FNP  Ergocalciferol (VITAMIN D2 PO) Take 1,000 Units by mouth daily.     [provider]  fenofibrate 160 MG tablet TAKE 1 TABLET BY MOUTH EVERY DAY Patient taking differently: Take 160 mg by mouth daily. 01/29/22   Arnette Felts, FNP  ferrous sulfate 325 (65 FE) MG tablet TAKE 1 TABLET BY MOUTH EVERY DAY Patient taking differently: Take 325 mg by mouth daily. 02/11/22   Arnette Felts, FNP  furosemide (LASIX) 20 MG tablet Take 2 tablets (40 mg total) by mouth daily. 07/21/21   Nahser, Deloris Ping, MD  hydrALAZINE (APRESOLINE) 100 MG tablet Take 100 mg by mouth 3 (three) times daily.    [provider]  isosorbide mononitrate (IMDUR) 30 MG 24 hr tablet Take 1 tablet by mouth daily. 07/01/20   [provider]  losartan (COZAAR) 25 MG tablet Take 25 mg by mouth daily. 03/31/22   [provider]  triamcinolone ointment (KENALOG) 0.5 % Apply 1 application. topically 2 (two) times daily.  04/09/22   Arnette Felts, FNP      Allergies    Bee venom    Review of Systems   Review of Systems  All other systems reviewed and are negative.  Physical Exam Updated Vital Signs BP (!) 159/60   Pulse 75   Temp (!) 97.5 F (36.4 C) (Oral)   Resp (!) 31   Ht 5\' 8"  (1.727 m)   Wt 80.3 kg   SpO2 93%   BMI 26.91 kg/m  Physical Exam Vitals and nursing note reviewed.  Constitutional:      General: He is not in acute distress.    Appearance: He is well-developed.  HENT:     Head: Normocephalic and atraumatic.  Eyes:     Conjunctiva/sclera: Conjunctivae normal.  Cardiovascular:     Rate and Rhythm: Normal rate and regular rhythm.  Pulmonary:     Effort: Pulmonary effort is normal. No respiratory distress.     Breath sounds: No stridor.  Abdominal:     General: There is no distension.  Skin:    General: Skin is warm and dry.  Neurological:     Mental Status: He is alert and oriented to person, place, and time.    ED Results / Procedures / Treatments   Labs (all labs ordered are listed, but only abnormal results are displayed) Labs Reviewed  BASIC METABOLIC PANEL - Abnormal; Notable for the following components:  Result Value   Glucose, Bld 110 (*)    BUN 35 (*)    Creatinine, Ser 2.25 (*)    GFR, Estimated 29 (*)    All other components within normal limits  CBC - Abnormal; Notable for the following components:   WBC 15.9 (*)    RBC 3.95 (*)    Hemoglobin 11.5 (*)    HCT 35.6 (*)    RDW 16.3 (*)    Platelets 524 (*)    All other components within normal limits  TROPONIN I (HIGH SENSITIVITY)  TROPONIN I (HIGH SENSITIVITY)    EKG EKG Interpretation  Date/Time:  Sunday April 12 2022 10:58:07 EDT Ventricular Rate:  64 PR Interval:  192 QRS Duration: 126 QT Interval:  456 QTC Calculation: 470 R Axis:   -56 Text Interpretation: Normal sinus rhythm Left axis deviation Non-specific intra-ventricular conduction block Minimal voltage criteria for LVH, may  be normal variant ( Cornell product ) Abnormal ECG Confirmed by Carmin Muskrat [phone]) on 04/12/2022 12:12:58 PM  Radiology DG Chest 2 View  Result Date: 04/12/2022 CLINICAL DATA:  Central chest pain for 2 days. EXAM: CHEST - 2 VIEW COMPARISON:  None Available. FINDINGS: The heart size and mediastinal contours are within normal limits. Both lungs are clear. The visualized skeletal structures are unremarkable. IMPRESSION: No active cardiopulmonary disease. Electronically Signed   By: Marlaine Hind M.D.   On: 04/12/2022 11:46    Procedures Procedures    Medications Ordered in ED Medications  morphine (PF) 4 MG/ML injection 4 mg (4 mg Intravenous Given 04/12/22 1328)  dexamethasone (DECADRON) injection 10 mg (10 mg Intravenous Given 04/12/22 1329)    ED Course/ Medical Decision Making/ A&P This patient with a Hx of hypertension, CKD presents to the ED for concern of chest pain, this involves an extensive number of treatment options, and is a complaint that carries with it a high risk of complications and morbidity.    The differential diagnosis includes ACS, pneumonia, musculoskeletal etiology   Social Determinants of Health:  Age, CKD  Additional history obtained:  Additional history and/or information obtained from chart review and granddaughter, notable for chart review notable for patient seeing primary care 3 days ago for ongoing management of his hypertension, and referral to nephrology.  Granddaughter reports corroboration of today's HPI  Cardiology notes from the past 2 years reviewed, with echocardiogram and a stress test in the past 5 years included below: IMPRESSIONS     1. Left ventricular ejection fraction, by estimation, is 60 to 65%. The  left ventricle has normal function. The left ventricle has no regional  wall motion abnormalities. There is mild left ventricular hypertrophy.  Left ventricular diastolic parameters  are consistent with Grade I diastolic dysfunction  (impaired relaxation).   2. Right ventricular systolic function is normal. The right ventricular  size is normal. Tricuspid regurgitation signal is inadequate for assessing  PA pressure.   3. The mitral valve is normal in structure. Trivial mitral valve  regurgitation. No evidence of mitral stenosis.   4. The aortic valve is tricuspid. Aortic valve regurgitation is not  visualized. Mild aortic valve sclerosis is present, with no evidence of  aortic valve stenosis.   5. The inferior vena cava is normal in size with greater than 50%  respiratory variability, suggesting right atrial pressure of 3 mmHg.   Nuclear stress EF: 51%. There was no ST segment deviation noted during stress. Defect 1: There is a medium defect of moderate severity present in  the basal inferior and mid inferior location. This is a low risk study. The left ventricular ejection fraction is mildly decreased (45-54%).    After the initial evaluation, orders, including: Labs x-ray were initiated.   Patient placed on Cardiac and Pulse-Oximetry Monitors. The patient was maintained on a cardiac monitor.  The cardiac monitored showed an rhythm of 70 sinus normal The patient was also maintained on pulse oximetry. The readings were typically 98% room air normal   On repeat evaluation of the patient improved  2:43 PM Patient joined by his granddaughter, and son, we discussed all findings at length.  Lab Tests:  I personally interpreted labs.  The pertinent results include: Persistent demonstration of renal dysfunction.  Troponin delta 2  Imaging Studies ordered:  I independently visualized and interpreted imaging which showed no pneumonia I agree with the radiologist interpretation   Dispostion / Final MDM:  After consideration of the diagnostic results and the patient's response to treatment, adult male presenting with chest pain is awake, alert.  Onset of pain following playing golf for the first time in some  time to suggest a musculoskeletal etiology.  No evidence for ACS, pneumothorax or pneumonia.  Patient has negligible risk profile for pulmonary embolism is not hypoxic.  He does have some tachypnea, but this is secondary, likely to his pain/sternal discomfort.  After lengthy conversation about likely etiology, return precautions and follow-up instructions, patient discharged with family members.  Final Clinical Impression(s) / ED Diagnoses Final diagnoses:  Atypical chest pain    Rx / DC Orders ED Discharge Orders          Ordered    predniSONE (DELTASONE) 20 MG tablet  Daily with breakfast        04/12/22 1440              Carmin Muskrat, MD 04/12/22 1443

## 2022-04-12 NOTE — ED Triage Notes (Signed)
C/o pain to center of chest since yesterday.  Denies SOB, nausea, and vomiting.  States it may be related to playing golf yesterday.

## 2022-04-12 NOTE — Discharge Instructions (Signed)
Sent to ED

## 2022-04-12 NOTE — ED Notes (Signed)
Patient is being discharged from the Urgent Care and sent to the Emergency Department via POV, due to refusing EMS transport. Per [employer], PA, patient is in need of higher level of care due to chest pain with EKG changes. Patient is aware and verbalizes understanding of plan of care.  Vitals:   04/12/22 1019  BP: (!) 162/61  Pulse: 68  Resp: 18  Temp: 98.3 F (36.8 C)  SpO2: 100%

## 2022-04-12 NOTE — Discharge Instructions (Signed)
As discussed, your evaluation today has been largely reassuring.  But, it is important that you monitor your condition carefully, and do not hesitate to return to the ED if you develop new, or concerning changes in your condition. ? ?Otherwise, please follow-up with your physician for appropriate ongoing care. ? ?

## 2022-04-12 NOTE — ED Provider Notes (Signed)
Seen in triage, vitals stable with complaints of chest pain. EKG obtained. Small ST elevations leads V2/V3, T wave inversions on comparison to previous reading. Recommend patient go to emergency department for evaluation. Declines EMS transport. POV to ED.   Eddie Hutchinson, [name], [place] 04/12/22 4503

## 2022-04-14 ENCOUNTER — Telehealth: Payer: Self-pay

## 2022-04-14 DIAGNOSIS — N184 Chronic kidney disease, stage 4 (severe): Secondary | ICD-10-CM | POA: Diagnosis not present

## 2022-04-14 NOTE — Telephone Encounter (Signed)
Pt called, checked in on pt after visiting the ED. Pt asked if he would like an follow up appointment, patient declined.

## 2022-04-26 ENCOUNTER — Encounter: Payer: Self-pay | Admitting: Nurse Practitioner

## 2022-04-27 ENCOUNTER — Encounter (HOSPITAL_COMMUNITY): Payer: Self-pay

## 2022-04-27 ENCOUNTER — Other Ambulatory Visit: Payer: Self-pay

## 2022-04-27 ENCOUNTER — Inpatient Hospital Stay (HOSPITAL_COMMUNITY)
Admission: EM | Admit: 2022-04-27 | Discharge: 2022-04-28 | DRG: 315 | Disposition: A | Discharge status: Home / Self Care | Payer: Medicare Other | Attending: Family Medicine | Admitting: Family Medicine

## 2022-04-27 ENCOUNTER — Emergency Department (HOSPITAL_COMMUNITY): Payer: Medicare Other

## 2022-04-27 DIAGNOSIS — I130 Hypertensive heart and chronic kidney disease with heart failure and stage 1 through stage 4 chronic kidney disease, or unspecified chronic kidney disease: Secondary | ICD-10-CM | POA: Diagnosis not present

## 2022-04-27 DIAGNOSIS — Z8249 Family history of ischemic heart disease and other diseases of the circulatory system: Secondary | ICD-10-CM | POA: Diagnosis not present

## 2022-04-27 DIAGNOSIS — R072 Precordial pain: Secondary | ICD-10-CM | POA: Diagnosis not present

## 2022-04-27 DIAGNOSIS — Z91030 Bee allergy status: Secondary | ICD-10-CM

## 2022-04-27 DIAGNOSIS — R079 Chest pain, unspecified: Secondary | ICD-10-CM | POA: Diagnosis present

## 2022-04-27 DIAGNOSIS — R7989 Other specified abnormal findings of blood chemistry: Secondary | ICD-10-CM

## 2022-04-27 DIAGNOSIS — D72829 Elevated white blood cell count, unspecified: Secondary | ICD-10-CM | POA: Diagnosis present

## 2022-04-27 DIAGNOSIS — I5032 Chronic diastolic (congestive) heart failure: Secondary | ICD-10-CM | POA: Diagnosis not present

## 2022-04-27 DIAGNOSIS — D649 Anemia, unspecified: Secondary | ICD-10-CM

## 2022-04-27 DIAGNOSIS — N189 Chronic kidney disease, unspecified: Secondary | ICD-10-CM

## 2022-04-27 DIAGNOSIS — D509 Iron deficiency anemia, unspecified: Secondary | ICD-10-CM | POA: Diagnosis present

## 2022-04-27 DIAGNOSIS — I319 Disease of pericardium, unspecified: Secondary | ICD-10-CM | POA: Diagnosis not present

## 2022-04-27 DIAGNOSIS — D631 Anemia in chronic kidney disease: Secondary | ICD-10-CM | POA: Diagnosis not present

## 2022-04-27 DIAGNOSIS — N183 Chronic kidney disease, stage 3 unspecified: Secondary | ICD-10-CM | POA: Diagnosis not present

## 2022-04-27 DIAGNOSIS — R0602 Shortness of breath: Secondary | ICD-10-CM | POA: Diagnosis not present

## 2022-04-27 DIAGNOSIS — J9601 Acute respiratory failure with hypoxia: Secondary | ICD-10-CM | POA: Diagnosis not present

## 2022-04-27 DIAGNOSIS — R091 Pleurisy: Secondary | ICD-10-CM | POA: Diagnosis not present

## 2022-04-27 DIAGNOSIS — E785 Hyperlipidemia, unspecified: Secondary | ICD-10-CM | POA: Diagnosis not present

## 2022-04-27 DIAGNOSIS — Z20822 Contact with and (suspected) exposure to covid-19: Secondary | ICD-10-CM | POA: Diagnosis not present

## 2022-04-27 DIAGNOSIS — I129 Hypertensive chronic kidney disease with stage 1 through stage 4 chronic kidney disease, or unspecified chronic kidney disease: Secondary | ICD-10-CM | POA: Diagnosis not present

## 2022-04-27 DIAGNOSIS — Z79899 Other long term (current) drug therapy: Secondary | ICD-10-CM | POA: Diagnosis not present

## 2022-04-27 DIAGNOSIS — Z87891 Personal history of nicotine dependence: Secondary | ICD-10-CM

## 2022-04-27 DIAGNOSIS — N1832 Chronic kidney disease, stage 3b: Secondary | ICD-10-CM | POA: Diagnosis not present

## 2022-04-27 DIAGNOSIS — I10 Essential (primary) hypertension: Secondary | ICD-10-CM | POA: Diagnosis not present

## 2022-04-27 DIAGNOSIS — R700 Elevated erythrocyte sedimentation rate: Secondary | ICD-10-CM | POA: Diagnosis not present

## 2022-04-27 DIAGNOSIS — R0600 Dyspnea, unspecified: Secondary | ICD-10-CM

## 2022-04-27 LAB — CBC
HCT: 29.8 % — ABNORMAL LOW (ref 39.0–52.0)
Hemoglobin: 9.9 g/dL — ABNORMAL LOW (ref 13.0–17.0)
MCH: 29.6 pg (ref 26.0–34.0)
MCHC: 33.2 g/dL (ref 30.0–36.0)
MCV: 89 fL (ref 80.0–100.0)
Platelets: 422 K/uL — ABNORMAL HIGH (ref 150–400)
RBC: 3.35 MIL/uL — ABNORMAL LOW (ref 4.22–5.81)
RDW: 16.4 % — ABNORMAL HIGH (ref 11.5–15.5)
WBC: 15.2 K/uL — ABNORMAL HIGH (ref 4.0–10.5)
nRBC: 0 % (ref 0.0–0.2)

## 2022-04-27 LAB — BASIC METABOLIC PANEL WITH GFR
Anion gap: 9 (ref 5–15)
BUN: 19 mg/dL (ref 8–23)
CO2: 22 mmol/L (ref 22–32)
Calcium: 8.9 mg/dL (ref 8.9–10.3)
Chloride: 108 mmol/L (ref 98–111)
Creatinine, Ser: 2.2 mg/dL — ABNORMAL HIGH (ref 0.61–1.24)
GFR, Estimated: 30 mL/min — ABNORMAL LOW (ref >60)
Glucose, Bld: 171 mg/dL — ABNORMAL HIGH (ref 70–99)
Potassium: 3.5 mmol/L (ref 3.5–5.1)
Sodium: 139 mmol/L (ref 135–145)

## 2022-04-27 LAB — SARS CORONAVIRUS 2 BY RT PCR: SARS Coronavirus 2 by RT PCR: NEGATIVE

## 2022-04-27 LAB — D-DIMER, QUANTITATIVE: D-Dimer, Quant: 0.84 ug{FEU}/mL — ABNORMAL HIGH (ref 0.00–0.50)

## 2022-04-27 LAB — BRAIN NATRIURETIC PEPTIDE: B Natriuretic Peptide: 298.6 pg/mL — ABNORMAL HIGH (ref 0.0–100.0)

## 2022-04-27 LAB — TROPONIN I (HIGH SENSITIVITY)
Troponin I (High Sensitivity): 13 ng/L (ref <18)
Troponin I (High Sensitivity): 12 ng/L (ref <18)

## 2022-04-27 NOTE — ED Provider Notes (Signed)
[facility] Provider Note   CSN: [id number] Arrival date & time: 04/27/22  1357    History {Add pertinent medical, surgical, social history, OB history to HPI:1} Chief Complaint  Patient presents with   Chest Pain    Eddie Hutchinson is a 81 y.o. male hx of HTN, hyperlipidemia, CKD here for evaluation of CP. Began approximately 2 weeks ago, intermittently resolved however worsened yesterday, and is now constant however does fluctuate in intensity.  Pain worse with exertion as well as deep breathing.  He feels significantly short of breath.  States he does have some intermittent lower extremity edema.  Sleeps with 2 large pillows at night per patient.  Took Tylenol this without relief.  He is unsure of any prior cardiac history.  Denies fever, cough, back pain, jaw pain, arm pain, abdominal pain, numbness or weakness.  Denies prior PE, DVT, recent surgery, immobilization or malignancy. Rates his pain a 9/10.  HPI     Home Medications Prior to Admission medications   Medication Sig Start Date End Date Taking? Authorizing Provider  amLODipine (NORVASC) 10 MG tablet TAKE 1 TABLET BY MOUTH EVERY DAY Patient taking differently: Take 10 mg by mouth daily. 12/12/21   [name], FNP  Ergocalciferol (VITAMIN D2 PO) Take 1,000 Units by mouth daily.     [provider]  fenofibrate 160 MG tablet TAKE 1 TABLET BY MOUTH EVERY DAY Patient taking differently: Take 160 mg by mouth daily. 01/29/22   [name], FNP  ferrous sulfate 325 (65 FE) MG tablet TAKE 1 TABLET BY MOUTH EVERY DAY Patient taking differently: Take 325 mg by mouth daily. 02/11/22   [name], FNP  furosemide (LASIX) 20 MG tablet Take 2 tablets (40 mg total) by mouth daily. 07/21/21   Nahser, Wonda Cheng, MD  hydrALAZINE (APRESOLINE) 100 MG tablet Take 100 mg by mouth 3 (three) times daily.    [provider]  isosorbide mononitrate (IMDUR) 30 MG 24 hr tablet Take 1 tablet by  mouth daily. 07/01/20   [provider]  losartan (COZAAR) 25 MG tablet Take 25 mg by mouth daily. 03/31/22   [provider]  predniSONE (DELTASONE) 20 MG tablet Take 2 tablets (40 mg total) by mouth daily with breakfast. For the next four days 04/12/22   Carmin Muskrat, MD  triamcinolone ointment (KENALOG) 0.5 % Apply 1 application. topically 2 (two) times daily. 04/09/22   [name], FNP      Allergies    Bee venom    Review of Systems   Review of Systems  Constitutional: Negative.   HENT: Negative.    Eyes: Negative.   Respiratory:  Positive for shortness of breath.   Cardiovascular:  Positive for chest pain and leg swelling. Negative for palpitations.  Gastrointestinal: Negative.   Genitourinary: Negative.   Musculoskeletal: Negative.   Skin: Negative.   Neurological: Negative.   All other systems reviewed and are negative.   Physical Exam Updated Vital Signs BP (!) 157/57 (BP Location: Left Arm)   Pulse 66   Temp 100.3 F (37.9 C) (Oral)   Resp 17   Ht $R'5\' 8"'Ga$  (1.727 m)   Wt 80.3 kg   SpO2 97%   BMI 26.91 kg/m  Physical Exam Vitals and nursing note reviewed.  Constitutional:      General: He is not in acute distress.    Appearance: He is well-developed. He is not ill-appearing, toxic-appearing or diaphoretic.  HENT:  Head: Normocephalic and atraumatic.  Eyes:     Pupils: Pupils are equal, round, and reactive to light.  Cardiovascular:     Rate and Rhythm: Normal rate and regular rhythm.     Pulses:          Radial pulses are 2+ on the right side and 2+ on the left side.       Dorsalis pedis pulses are 2+ on the right side and 2+ on the left side.     Heart sounds: Normal heart sounds.  Pulmonary:     Effort: Pulmonary effort is normal. Tachypnea present. No respiratory distress.     Breath sounds: Normal breath sounds.  Chest:     Comments: Nontender chest wall Abdominal:     General: There is no distension.     Palpations: Abdomen is  soft.  Musculoskeletal:        General: Normal range of motion.     Cervical back: Normal range of motion and neck supple.     Right lower leg: No tenderness. No edema.     Left lower leg: No tenderness. No edema.     Comments: No bony tenderness, full range of motion, trace edema  Skin:    General: Skin is warm and dry.     Capillary Refill: Capillary refill takes less than 2 seconds.     Comments: No rash or lesions  Neurological:     General: No focal deficit present.     Mental Status: He is alert and oriented to person, place, and time.     ED Results / Procedures / Treatments   Labs (all labs ordered are listed, but only abnormal results are displayed) Labs Reviewed  BASIC METABOLIC PANEL - Abnormal; Notable for the following components:      Result Value   Glucose, Bld 171 (*)    Creatinine, Ser 2.20 (*)    GFR, Estimated 30 (*)    All other components within normal limits  CBC - Abnormal; Notable for the following components:   WBC 15.2 (*)    RBC 3.35 (*)    Hemoglobin 9.9 (*)    HCT 29.8 (*)    RDW 16.4 (*)    Platelets 422 (*)    All other components within normal limits  BRAIN NATRIURETIC PEPTIDE - Abnormal; Notable for the following components:   B Natriuretic Peptide 298.6 (*)    All other components within normal limits  TROPONIN I (HIGH SENSITIVITY)  TROPONIN I (HIGH SENSITIVITY)    EKG None  Radiology DG Chest 2 View  Result Date: 04/27/2022 CLINICAL DATA:  Chest pain EXAM: CHEST - 2 VIEW COMPARISON:  04/12/2022 FINDINGS: Cardiac size is within normal limits. There are no signs of pulmonary edema. There are small linear densities in the lower lung fields suggesting scarring or subsegmental atelectasis. There is minimal blunting of lateral CP angles. There is no pneumothorax. IMPRESSION: Small linear densities in the lower lung fields suggest scarring or subsegmental atelectasis. There are no signs of pulmonary edema or focal pulmonary consolidation.  Electronically Signed   By: Elmer Picker M.D.   On: 04/27/2022 14:48    Procedures Procedures  {Document cardiac monitor, telemetry assessment procedure when appropriate:1}  Medications Ordered in ED Medications - No data to display  ED Course/ Medical Decision Making/ A&P    81 year old here for evaluation of chest pain and shortness of breath, initially intermittent however now constant.  Pain and shortness of breath is exertional and  pleuritic in nature.  States he cannot lay flat at night to sleep with 2 pillows.  He denies any prior history of CHF.  States he had some intermittent lower extremity swelling.  No history of PE or DVT.  Initially had a temp of 100.3 on arrival however defervesced without any antipyretic.  On my initial evaluation patient is extremely dyspneic breathing 42 times a minute.  He was placed on subsequent 2 L nasal cannula with some improvement.   I did review his prior cardiology scans approximately 5 years ago he had a Lexi scan without any significant ischemia as well as had echocardiogram approximately 2 years ago with EF 60 to 65%  Labs and imaging personally viewed and interpreted:  CBC WBC 15.2, hemoglobin 9.9, baseline 37.6 Metabolic panel creatinine 2.20, similar to prior Delta troponin flat BNP 298 D-dimer COVID Chest x-ray with lower lung fields with scarring versus atelectasis EKG without ischemic changes CT chest wo ***                           Medical Decision Making Amount and/or Complexity of Data Reviewed Independent Historian: caregiver External Data Reviewed: labs, radiology, ECG and notes. Labs: ordered. Decision-making details documented in ED Course. Radiology: ordered and independent interpretation performed. Decision-making details documented in ED Course. ECG/medicine tests: ordered and independent interpretation performed. Decision-making details documented in ED Course.  Risk OTC drugs. Prescription drug  management. Parenteral controlled substances. Decision regarding hospitalization. Diagnosis or treatment significantly limited by social determinants of health.   {Document critical care time when appropriate:1} {Document review of labs and clinical decision tools ie heart score, Chads2Vasc2 etc:1}  {Document your independent review of radiology images, and any outside records:1} {Document your discussion with family members, caretakers, and with consultants:1} {Document social determinants of health affecting pt's care:1} {Document your decision making why or why not admission, treatments were needed:1} Final Clinical Impression(s) / ED Diagnoses Final diagnoses:  None    Rx / DC Orders ED Discharge Orders     None

## 2022-04-27 NOTE — ED Provider Triage Note (Signed)
Emergency Medicine Provider Triage Evaluation Note  MARKEVIOUS EHMKE , a 81 y.o. male  was evaluated in triage.  Pt complains of chest pain.  Denies any significant shortness of breath or cough.  Intermittent bilateral lower extremity edema.  His chest pain began yesterday and has been intermittent.  Pain is pleuritic in nature.  Took Tylenol this morning without much improvement.  No cardiac history that he is aware of.  Review of Systems  Positive: Chest pain, intermittent bilateral lower extremity edema Negative: Shortness of breath, cough  Physical Exam  BP (!) 156/47 (BP Location: Right Arm)   Pulse 75   Temp 100.3 F (37.9 C) (Oral)   Resp (!) 26   Ht $R'5\' 8"'Cx$  (1.727 m)   Wt 80.3 kg   SpO2 99%   BMI 26.91 kg/m  Gen:   Awake, no distress   Resp:  Normal effort  MSK:   Moves extremities without difficulty  Other:  Lungs clear bilaterally.  No pitting edema noted to bilateral lower extremities  Medical Decision Making  Medically screening exam initiated at 2:15 PM.  Appropriate orders placed.  Estanislado Pandy was informed that the remainder of the evaluation will be completed by another provider, this initial triage assessment does not replace that evaluation, and the importance of remaining in the ED until their evaluation is complete.  Work-up initiated   Delia Heady, PA-C 04/27/22 1416

## 2022-04-27 NOTE — ED Triage Notes (Signed)
Reports chest pain that started Sunday morning located in the middle of his chest.  Denies any other symptoms but appears SOB in triage.

## 2022-04-28 ENCOUNTER — Inpatient Hospital Stay (HOSPITAL_COMMUNITY): Payer: Medicare Other

## 2022-04-28 ENCOUNTER — Emergency Department (HOSPITAL_COMMUNITY): Payer: Medicare Other

## 2022-04-28 ENCOUNTER — Encounter (HOSPITAL_COMMUNITY): Payer: Self-pay | Admitting: Internal Medicine

## 2022-04-28 DIAGNOSIS — E785 Hyperlipidemia, unspecified: Secondary | ICD-10-CM | POA: Diagnosis present

## 2022-04-28 DIAGNOSIS — D649 Anemia, unspecified: Secondary | ICD-10-CM

## 2022-04-28 DIAGNOSIS — N183 Chronic kidney disease, stage 3 unspecified: Secondary | ICD-10-CM

## 2022-04-28 DIAGNOSIS — R700 Elevated erythrocyte sedimentation rate: Secondary | ICD-10-CM | POA: Diagnosis present

## 2022-04-28 DIAGNOSIS — I10 Essential (primary) hypertension: Secondary | ICD-10-CM | POA: Diagnosis not present

## 2022-04-28 DIAGNOSIS — I130 Hypertensive heart and chronic kidney disease with heart failure and stage 1 through stage 4 chronic kidney disease, or unspecified chronic kidney disease: Secondary | ICD-10-CM | POA: Diagnosis present

## 2022-04-28 DIAGNOSIS — R072 Precordial pain: Secondary | ICD-10-CM | POA: Diagnosis not present

## 2022-04-28 DIAGNOSIS — Z8249 Family history of ischemic heart disease and other diseases of the circulatory system: Secondary | ICD-10-CM | POA: Diagnosis not present

## 2022-04-28 DIAGNOSIS — R079 Chest pain, unspecified: Secondary | ICD-10-CM | POA: Diagnosis not present

## 2022-04-28 DIAGNOSIS — I5032 Chronic diastolic (congestive) heart failure: Secondary | ICD-10-CM | POA: Diagnosis present

## 2022-04-28 DIAGNOSIS — Z79899 Other long term (current) drug therapy: Secondary | ICD-10-CM | POA: Diagnosis not present

## 2022-04-28 DIAGNOSIS — D72829 Elevated white blood cell count, unspecified: Secondary | ICD-10-CM | POA: Diagnosis present

## 2022-04-28 DIAGNOSIS — Z87891 Personal history of nicotine dependence: Secondary | ICD-10-CM | POA: Diagnosis not present

## 2022-04-28 DIAGNOSIS — I319 Disease of pericardium, unspecified: Secondary | ICD-10-CM

## 2022-04-28 DIAGNOSIS — Z20822 Contact with and (suspected) exposure to covid-19: Secondary | ICD-10-CM | POA: Diagnosis present

## 2022-04-28 DIAGNOSIS — N1832 Chronic kidney disease, stage 3b: Secondary | ICD-10-CM | POA: Diagnosis present

## 2022-04-28 DIAGNOSIS — D509 Iron deficiency anemia, unspecified: Secondary | ICD-10-CM | POA: Diagnosis present

## 2022-04-28 DIAGNOSIS — Z91030 Bee allergy status: Secondary | ICD-10-CM | POA: Diagnosis not present

## 2022-04-28 DIAGNOSIS — D631 Anemia in chronic kidney disease: Secondary | ICD-10-CM | POA: Diagnosis present

## 2022-04-28 LAB — CBC WITH DIFFERENTIAL/PLATELET
Abs Immature Granulocytes: 0.06 10*3/uL (ref 0.00–0.07)
Basophils Absolute: 0.1 10*3/uL (ref 0.0–0.1)
Basophils Relative: 1 %
Eosinophils Absolute: 0.1 10*3/uL (ref 0.0–0.5)
Eosinophils Relative: 1 %
HCT: 29.8 % — ABNORMAL LOW (ref 39.0–52.0)
Hemoglobin: 9.5 g/dL — ABNORMAL LOW (ref 13.0–17.0)
Immature Granulocytes: 0 %
Lymphocytes Relative: 6 %
Lymphs Abs: 0.8 10*3/uL (ref 0.7–4.0)
MCH: 28.8 pg (ref 26.0–34.0)
MCHC: 31.9 g/dL (ref 30.0–36.0)
MCV: 90.3 fL (ref 80.0–100.0)
Monocytes Absolute: 1.2 10*3/uL — ABNORMAL HIGH (ref 0.1–1.0)
Monocytes Relative: 8 %
Neutro Abs: 12 10*3/uL — ABNORMAL HIGH (ref 1.7–7.7)
Neutrophils Relative %: 84 %
Platelets: 408 10*3/uL — ABNORMAL HIGH (ref 150–400)
RBC: 3.3 MIL/uL — ABNORMAL LOW (ref 4.22–5.81)
RDW: 16.2 % — ABNORMAL HIGH (ref 11.5–15.5)
WBC: 14.2 10*3/uL — ABNORMAL HIGH (ref 4.0–10.5)
nRBC: 0 % (ref 0.0–0.2)

## 2022-04-28 LAB — COMPREHENSIVE METABOLIC PANEL
ALT: 26 U/L (ref 0–44)
AST: 28 U/L (ref 15–41)
Albumin: 2.9 g/dL — ABNORMAL LOW (ref 3.5–5.0)
Alkaline Phosphatase: 30 U/L — ABNORMAL LOW (ref 38–126)
Anion gap: 9 (ref 5–15)
BUN: 20 mg/dL (ref 8–23)
CO2: 22 mmol/L (ref 22–32)
Calcium: 8.9 mg/dL (ref 8.9–10.3)
Chloride: 107 mmol/L (ref 98–111)
Creatinine, Ser: 2.05 mg/dL — ABNORMAL HIGH (ref 0.61–1.24)
GFR, Estimated: 32 mL/min — ABNORMAL LOW (ref >60)
Glucose, Bld: 108 mg/dL — ABNORMAL HIGH (ref 70–99)
Potassium: 3.9 mmol/L (ref 3.5–5.1)
Sodium: 138 mmol/L (ref 135–145)
Total Bilirubin: 0.6 mg/dL (ref 0.3–1.2)
Total Protein: 6 g/dL — ABNORMAL LOW (ref 6.5–8.1)

## 2022-04-28 LAB — C-REACTIVE PROTEIN: CRP: 16.3 mg/dL — ABNORMAL HIGH (ref <1.0)

## 2022-04-28 LAB — ECHOCARDIOGRAM COMPLETE
AR max vel: 2.33 cm2
AV Peak grad: 13.8 mmHg
Ao pk vel: 1.86 m/s
Area-P 1/2: 4.17 cm2
Calc EF: 58.5 %
Height: 68 in
S' Lateral: 3.8 cm
Single Plane A2C EF: 58.5 %
Single Plane A4C EF: 57.6 %
Weight: 2832 oz

## 2022-04-28 LAB — SEDIMENTATION RATE: Sed Rate: 75 mm/hr — ABNORMAL HIGH (ref 0–16)

## 2022-04-28 LAB — PHOSPHORUS: Phosphorus: 3.2 mg/dL (ref 2.5–4.6)

## 2022-04-28 LAB — MAGNESIUM: Magnesium: 2 mg/dL (ref 1.7–2.4)

## 2022-04-28 MED ORDER — HYDROMORPHONE HCL 1 MG/ML IJ SOLN: 0.5000 mg | INTRAMUSCULAR | Status: DC | PRN

## 2022-04-28 MED ORDER — PANTOPRAZOLE SODIUM 40 MG PO TBEC
40.0000 mg | DELAYED_RELEASE_TABLET | Freq: Every day | ORAL | Status: DC
  Administered 2022-04-28: 40 mg via ORAL
  Filled 2022-04-28: qty 1

## 2022-04-28 MED ORDER — NITROGLYCERIN 0.4 MG SL SUBL
0.4000 mg | SUBLINGUAL_TABLET | SUBLINGUAL | Status: DC | PRN
  Administered 2022-04-28: 0.4 mg via SUBLINGUAL
  Filled 2022-04-28: qty 1

## 2022-04-28 MED ORDER — PANTOPRAZOLE SODIUM 40 MG PO TBEC: 40.0000 mg | DELAYED_RELEASE_TABLET | Freq: Every day | ORAL | 0 refills | Status: AC

## 2022-04-28 MED ORDER — ACETAMINOPHEN 325 MG PO TABS: 650.0000 mg | ORAL_TABLET | Freq: Four times a day (QID) | ORAL | Status: DC | PRN

## 2022-04-28 MED ORDER — ONDANSETRON HCL 4 MG/2ML IJ SOLN: 4.0000 mg | Freq: Four times a day (QID) | INTRAMUSCULAR | Status: DC | PRN

## 2022-04-28 MED ORDER — COLCHICINE 0.6 MG PO TABS: 0.6000 mg | ORAL_TABLET | Freq: Every day | ORAL | 0 refills | Status: DC

## 2022-04-28 MED ORDER — HYDRALAZINE HCL 25 MG PO TABS
100.0000 mg | ORAL_TABLET | Freq: Three times a day (TID) | ORAL | Status: DC
  Administered 2022-04-28: 100 mg via ORAL
  Filled 2022-04-28: qty 4

## 2022-04-28 MED ORDER — POLYETHYLENE GLYCOL 3350 17 G PO PACK: 17.0000 g | PACK | Freq: Every day | ORAL | Status: DC | PRN

## 2022-04-28 MED ORDER — PREDNISONE 20 MG PO TABS
40.0000 mg | ORAL_TABLET | Freq: Every day | ORAL | Status: DC
  Administered 2022-04-28: 40 mg via ORAL
  Filled 2022-04-28: qty 2

## 2022-04-28 MED ORDER — ISOSORBIDE MONONITRATE ER 30 MG PO TB24
30.0000 mg | ORAL_TABLET | Freq: Every day | ORAL | Status: DC
  Administered 2022-04-28: 30 mg via ORAL
  Filled 2022-04-28: qty 1

## 2022-04-28 MED ORDER — HYDRALAZINE HCL 50 MG PO TABS
100.0000 mg | ORAL_TABLET | Freq: Three times a day (TID) | ORAL | Status: DC
Start: 2022-04-28 — End: 2022-04-28
  Administered 2022-04-28 (×2): 100 mg via ORAL
  Filled 2022-04-28 (×4): qty 2

## 2022-04-28 MED ORDER — HEPARIN SODIUM (PORCINE) 5000 UNIT/ML IJ SOLN
5000.0000 [IU] | Freq: Three times a day (TID) | INTRAMUSCULAR | Status: DC
  Administered 2022-04-28 (×2): 5000 [IU] via SUBCUTANEOUS
  Filled 2022-04-28 (×2): qty 1

## 2022-04-28 MED ORDER — COLCHICINE 0.6 MG PO TABS
0.6000 mg | ORAL_TABLET | Freq: Two times a day (BID) | ORAL | Status: DC
  Administered 2022-04-28: 0.6 mg via ORAL
  Filled 2022-04-28 (×2): qty 1

## 2022-04-28 MED ORDER — TECHNETIUM TO 99M ALBUMIN AGGREGATED
4.1000 | Freq: Once | INTRAVENOUS | Status: AC | PRN
  Administered 2022-04-28: 4.1 via INTRAVENOUS

## 2022-04-28 MED ORDER — OXYCODONE HCL 5 MG PO TABS: 5.0000 mg | ORAL_TABLET | Freq: Four times a day (QID) | ORAL | Status: DC | PRN

## 2022-04-28 MED ORDER — VITAMIN D 25 MCG (1000 UNIT) PO TABS
1000.0000 [IU] | ORAL_TABLET | Freq: Every day | ORAL | Status: DC
  Administered 2022-04-28: 1000 [IU] via ORAL
  Filled 2022-04-28: qty 1

## 2022-04-28 MED ORDER — ASPIRIN 81 MG PO CHEW
324.0000 mg | CHEWABLE_TABLET | Freq: Once | ORAL | Status: AC
Start: 2022-04-28 — End: 2022-04-28
  Administered 2022-04-28: 324 mg via ORAL
  Filled 2022-04-28: qty 4

## 2022-04-28 MED ORDER — FERROUS SULFATE 325 (65 FE) MG PO TABS
325.0000 mg | ORAL_TABLET | Freq: Every day | ORAL | Status: DC
  Administered 2022-04-28: 325 mg via ORAL
  Filled 2022-04-28: qty 1

## 2022-04-28 MED ORDER — FENOFIBRATE 160 MG PO TABS
160.0000 mg | ORAL_TABLET | Freq: Every day | ORAL | Status: DC
  Administered 2022-04-28: 160 mg via ORAL
  Filled 2022-04-28: qty 1

## 2022-04-28 MED ORDER — PREDNISONE 20 MG PO TABS: ORAL_TABLET | ORAL | 0 refills | Status: AC

## 2022-04-28 MED ORDER — MELATONIN 5 MG PO TABS: 5.0000 mg | ORAL_TABLET | Freq: Every evening | ORAL | Status: DC | PRN

## 2022-04-28 NOTE — Assessment & Plan Note (Signed)
Follow outpatient and trend

## 2022-04-28 NOTE — ED Notes (Signed)
Pt transported to [facility].

## 2022-04-28 NOTE — Assessment & Plan Note (Signed)
Follow outpatient  

## 2022-04-28 NOTE — Hospital Course (Signed)
Eddie Hutchinson is Eddie Hutchinson 81 y.o. male with medical history significant for hypertension, hyperlipidemia, nephrolithiasis, family history of premature coronary artery disease, who presented to [facility] ED from home due to constant nonexertional chest pain x2 days, sharp in nature centrally located, non-radiating.  His chest pain is worse with laying flat, supine, and improved with sitting up and leaning forward.  His pain level goes from 9 out of 10 lying supine to nearly 0 out of 10 leaning forward.  Denies dyspnea or cough.  Not hypoxic.   He was admitted for pericarditis.  Discharged after improvement with colchicine and steroids.  Appreciate cardiology assistance.

## 2022-04-28 NOTE — ED Notes (Signed)
In CT

## 2022-04-28 NOTE — Assessment & Plan Note (Signed)
Follows with renal Appears at baseline

## 2022-04-28 NOTE — Assessment & Plan Note (Addendum)
Based on sx, EKG findings Echo with EF 55-60%, no RWMA CT chest with mild atelectatic changes, mediastinal nodes up to 1 cm in short axis, likely reactive VQ low prob for PE Elevated ESR, CRP Negative troponin x2 Cardiology c/s, appreciate recs -> colchcine and steroids at discharge, relatively short course of each per cards over next 10 days for colchicine and 7 days for steroids

## 2022-04-28 NOTE — Assessment & Plan Note (Signed)
Resume home meds at discharge

## 2022-04-28 NOTE — ED Notes (Signed)
Dr. Florene Glen at bedside and requests to "hold off" on sending pt upstairs until PT can evaluate for possible discharge.

## 2022-04-28 NOTE — Discharge Summary (Signed)
Physician Discharge Summary  [name]:[id number] DOB: 1941/06/13 DOA: 04/27/2022  PCP: [name], FNP  Admit date: 04/27/2022 Discharge date: 04/28/2022  Time spent: 40 minutes  Recommendations for Outpatient Follow-up:  Follow outpatient CBC/CMp  Follow with cardiology outpatient after 7 days prednisone/10 days colchicine Follow inflammatory markers Follow reactive mediastinal LN's outpatient   Discharge Diagnoses:  Principal Problem:   Chest pain Active Problems:   Pericarditis   Leukocytosis   Benign essential HTN   Anemia   Stage 3b chronic kidney disease ([place])   Discharge Condition: stable  Diet recommendation: heart healthy  Filed Weights   04/27/22 1410  Weight: 80.3 kg    History of present illness:  Eddie Hutchinson is Eddie Hutchinson 81 y.o. male with medical history significant for hypertension, hyperlipidemia, nephrolithiasis, family history of premature coronary artery disease, who presented to [facility] ED from home due to constant nonexertional chest pain x2 days, sharp in nature centrally located, non-radiating.  His chest pain is worse with laying flat, supine, and improved with sitting up and leaning forward.  His pain level goes from 9 out of 10 lying supine to nearly 0 out of 10 leaning forward.  Denies dyspnea or cough.  Not hypoxic.   He was admitted for pericarditis.  Discharged after improvement with colchicine and steroids.  Appreciate cardiology assistance.    Hospital Course:  Assessment and Plan: Pericarditis Based on sx, EKG findings Echo with EF 55-60%, no RWMA CT chest with mild atelectatic changes, mediastinal nodes up to 1 cm in short axis, likely reactive VQ low prob for PE Elevated ESR, CRP Negative troponin x2 Cardiology c/s, appreciate recs -> colchcine and steroids at discharge, relatively short course of each per cards over next 10 days for colchicine and 7 days for steroids    Leukocytosis Follow outpatient  Anemia Follow  outpatient and trend  Benign essential HTN Resume home meds at discharge  Stage 3b chronic kidney disease ([place]) Follows with renal Appears at baseline      Procedures:  IMPRESSIONS     1. Left ventricular ejection fraction, by estimation, is 55 to 60%. The  left ventricle has normal function. The left ventricle has no regional  wall motion abnormalities. Left ventricular diastolic parameters are  consistent with Grade I diastolic  dysfunction (impaired relaxation).   2. Right ventricular systolic function is normal. The right ventricular  size is normal. Tricuspid regurgitation signal is inadequate for assessing  PA pressure.   3. The mitral valve is normal in structure. Trivial mitral valve  regurgitation. No evidence of mitral stenosis.   4. The aortic valve is tricuspid. There is mild calcification of the  aortic valve. Aortic valve regurgitation is not visualized.   5. The inferior vena cava is normal in size with greater than 50%  respiratory variability, suggesting right atrial pressure of 3 mmHg.   Comparison(s): Similar to 2021 report.   Consultations: cardiology  Discharge Exam: Vitals:   04/28/22 1612 04/28/22 1613  BP:    Pulse: 85 85  Resp: (!) 22 (!) 27  Temp:    SpO2: 98% 97%   C/o pleuritic pain, improved leaning forward Symptoms improved at end of day Granddaughter at bedside Discussed d/c plan  General: No acute distress. Cardiovascular: Heart sounds show Lorree Millar regular rate, and rhythm. No gallops or rubs.  Lungs: Clear to auscultation bilaterally  Abdomen: Soft, nontender, nondistended  Neurological: Alert and oriented 3. Moves all extremities 4 with equal strength. Cranial nerves II through  XII grossly intact. Extremities: No clubbing or cyanosis. No edema.   Discharge Instructions   Discharge Instructions     Call MD for:  difficulty breathing, headache or visual disturbances   Complete by: As directed    Call MD for:  extreme fatigue    Complete by: As directed    Call MD for:  hives   Complete by: As directed    Call MD for:  persistant dizziness or light-headedness   Complete by: As directed    Call MD for:  persistant nausea and vomiting   Complete by: As directed    Call MD for:  redness, tenderness, or signs of infection (pain, swelling, redness, odor or green/yellow discharge around incision site)   Complete by: As directed    Call MD for:  severe uncontrolled pain   Complete by: As directed    Call MD for:  temperature >100.4   Complete by: As directed    Diet - low sodium heart healthy   Complete by: As directed    Discharge instructions   Complete by: As directed    You were seen for chest pain.  This is due to pericarditis.  Inflammation around your heart.  We'll send you home with colchicine and steroids over the next week or so.  You'll have Baxter Gonzalez follow up appointment with cardiology early in July.  You had some mediastinal lymph nodes that appeared reactive.  Follow this with your outpatient doctors.  Return for new, recurrent, or worsening symptoms.  Please ask your PCP to request records from this hospitalization so they know what was done and what the next steps will be.   Increase activity slowly   Complete by: As directed       Allergies as of 04/28/2022       Reactions   Bee Venom Hives   As Rory Xiang child        Medication List     TAKE these medications    amLODipine 10 MG tablet Commonly known as: NORVASC TAKE 1 TABLET BY MOUTH EVERY DAY   colchicine 0.6 MG tablet Take 1 tablet (0.6 mg total) by mouth daily for 10 days.   fenofibrate 160 MG tablet TAKE 1 TABLET BY MOUTH EVERY DAY   ferrous sulfate 325 (65 FE) MG tablet TAKE 1 TABLET BY MOUTH EVERY DAY   furosemide 20 MG tablet Commonly known as: LASIX Take 2 tablets (40 mg total) by mouth daily. What changed:  how much to take when to take this additional instructions   hydrALAZINE 100 MG tablet Commonly known as:  APRESOLINE Take 100 mg by mouth 3 (three) times daily.   isosorbide mononitrate 30 MG 24 hr tablet Commonly known as: IMDUR Take 1 tablet by mouth daily.   losartan 25 MG tablet Commonly known as: COZAAR Take 25 mg by mouth daily.   pantoprazole 40 MG tablet Commonly known as: PROTONIX Take 1 tablet (40 mg total) by mouth daily. Start taking on: April 29, 2022   predniSONE 20 MG tablet Commonly known as: DELTASONE Take 2 tablets (40 mg total) by mouth daily with breakfast for 2 days, THEN 1.5 tablets (30 mg total) daily with breakfast for 2 days, THEN 1 tablet (20 mg total) daily with breakfast for 2 days, THEN 0.5 tablets (10 mg total) daily with breakfast for 2 days. Start taking on: April 29, 2022 What changed: See the new instructions.   triamcinolone ointment 0.5 % Commonly known as: KENALOG Apply 1 application.  topically 2 (two) times daily.   VITAMIN D2 PO Take 1,000 Units by mouth daily.       Allergies  Allergen Reactions   Bee Venom Hives    As Graviela Nodal child       The results of significant diagnostics from this hospitalization (including imaging, microbiology, ancillary and laboratory) are listed below for reference.    Significant Diagnostic Studies: ECHOCARDIOGRAM COMPLETE  Result Date: 04/28/2022    ECHOCARDIOGRAM REPORT   Patient Name:   OSHAY STRANAHAN Date of Exam: 04/28/2022 Medical Rec #:  [id number]       Height:       68.0 in Accession #:    [id number]      Weight:       177.0 lb Date of Birth:  1941-02-01       BSA:          1.940 m Patient Age:    33 years        BP:           151/57 mmHg Patient Gender: M               HR:           67 bpm. Exam Location:  Inpatient Procedure: 2D Echo, Cardiac Doppler and Color Doppler Indications:    Chest pain  History:        Patient has prior history of Echocardiogram examinations. Risk                 Factors:Hypertension.  Sonographer:    Jyl Heinz Referring Phys: [id number] [place]  1. Left  ventricular ejection fraction, by estimation, is 55 to 60%. The left ventricle has normal function. The left ventricle has no regional wall motion abnormalities. Left ventricular diastolic parameters are consistent with Grade I diastolic dysfunction (impaired relaxation).  2. Right ventricular systolic function is normal. The right ventricular size is normal. Tricuspid regurgitation signal is inadequate for assessing PA pressure.  3. The mitral valve is normal in structure. Trivial mitral valve regurgitation. No evidence of mitral stenosis.  4. The aortic valve is tricuspid. There is mild calcification of the aortic valve. Aortic valve regurgitation is not visualized.  5. The inferior vena cava is normal in size with greater than 50% respiratory variability, suggesting right atrial pressure of 3 mmHg. Comparison(s): Similar to 2021 report. FINDINGS  Left Ventricle: Left ventricular ejection fraction, by estimation, is 55 to 60%. The left ventricle has normal function. The left ventricle has no regional wall motion abnormalities. The left ventricular internal cavity size was normal in size. There is  no left ventricular hypertrophy. Left ventricular diastolic parameters are consistent with Grade I diastolic dysfunction (impaired relaxation). Right Ventricle: The right ventricular size is normal. No increase in right ventricular wall thickness. Right ventricular systolic function is normal. Tricuspid regurgitation signal is inadequate for assessing PA pressure. Left Atrium: Left atrial size was normal in size. Right Atrium: Right atrial size was normal in size. Pericardium: There is no evidence of pericardial effusion. Mitral Valve: The mitral valve is normal in structure. Trivial mitral valve regurgitation. No evidence of mitral valve stenosis. Tricuspid Valve: The tricuspid valve is normal in structure. Tricuspid valve regurgitation is not demonstrated. No evidence of tricuspid stenosis. Aortic Valve: The aortic  valve is tricuspid. There is mild calcification of the aortic valve. Aortic valve regurgitation is not visualized. Aortic valve peak gradient measures 13.8 mmHg. Pulmonic Valve: The pulmonic valve was normal in  structure. Pulmonic valve regurgitation is mild to moderate. No evidence of pulmonic stenosis. Aorta: The aortic root and ascending aorta are structurally normal, with no evidence of dilitation. Pulmonary Artery: The pulmonary artery is of normal size. Venous: The inferior vena cava is normal in size with greater than 50% respiratory variability, suggesting right atrial pressure of 3 mmHg. IAS/Shunts: No atrial level shunt detected by color flow Doppler.  LEFT VENTRICLE PLAX 2D LVIDd:         5.50 cm      Diastology LVIDs:         3.80 cm      LV e' medial:    8.70 cm/s LV PW:         1.00 cm      LV E/e' medial:  11.6 LV IVS:        1.00 cm      LV e' lateral:   9.03 cm/s LVOT diam:     2.00 cm      LV E/e' lateral: 11.2 LV SV:         85 LV SV Index:   44 LVOT Area:     3.14 cm  LV Volumes (MOD) LV vol d, MOD A2C: 120.0 ml LV vol d, MOD A4C: 147.0 ml LV vol s, MOD A2C: 49.8 ml LV vol s, MOD A4C: 62.4 ml LV SV MOD A2C:     70.2 ml LV SV MOD A4C:     147.0 ml LV SV MOD BP:      78.3 ml RIGHT VENTRICLE             IVC RV Basal diam:  3.10 cm     IVC diam: 1.80 cm RV Mid diam:    3.70 cm RV S prime:     11.90 cm/s TAPSE (M-mode): 2.2 cm LEFT ATRIUM             Index        RIGHT ATRIUM           Index LA diam:        3.90 cm 2.01 cm/m   RA Area:     13.70 cm LA Vol (A2C):   60.0 ml 30.92 ml/m  RA Volume:   32.00 ml  16.49 ml/m LA Vol (A4C):   54.5 ml 28.09 ml/m LA Biplane Vol: 58.4 ml 30.10 ml/m  AORTIC VALVE AV Area (Vmax): 2.33 cm AV Vmax:        186.00 cm/s AV Peak Grad:   13.8 mmHg LVOT Vmax:      138.00 cm/s LVOT Vmean:     92.700 cm/s LVOT VTI:       0.272 m  AORTA Ao Root diam: 3.40 cm Ao Asc diam:  2.80 cm MITRAL VALVE MV Area (PHT): 4.17 cm     SHUNTS MV Decel Time: 182 msec     Systemic VTI:   0.27 m MV E velocity: 101.00 cm/s  Systemic Diam: 2.00 cm MV Rahel Carlton velocity: 131.00 cm/s MV E/Egor Fullilove ratio:  0.77 Rudean Haskell MD Electronically signed by Rudean Haskell MD Signature Date/Time: 04/28/2022/11:43:42 AM    Final    NM Pulmonary Perfusion  Result Date: 04/28/2022 CLINICAL DATA:  Pleurisy EXAM: NUCLEAR MEDICINE PERFUSION LUNG SCAN TECHNIQUE: Perfusion images were obtained in multiple projections after intravenous injection of radiopharmaceutical. RADIOPHARMACEUTICALS:  4.0 mCi Tc-34mMAA COMPARISON:  CT chest 04/28/2022 FINDINGS: Single small peripheral perfusion defect within the posterior LEFT upper lobe. No additional peripheral perfusion defects within  LEFT or RIGHT lung. IMPRESSION: Low probability for acute pulmonary embolism. Electronically Signed   By: Suzy Bouchard M.D.   On: 04/28/2022 11:01   CT Chest Wo Contrast  Result Date: 04/28/2022 CLINICAL DATA:  Chest pain and shortness of breath EXAM: CT CHEST WITHOUT CONTRAST TECHNIQUE: Multidetector CT imaging of the chest was performed following the standard protocol without IV contrast. RADIATION DOSE REDUCTION: This exam was performed according to the departmental dose-optimization program which includes automated exposure control, adjustment of the mA and/or kV according to patient size and/or use of iterative reconstruction technique. COMPARISON:  Chest x-ray from the previous day. FINDINGS: Cardiovascular: Somewhat limited due to lack of IV contrast. Atherosclerotic calcifications of the aorta are seen. No aneurysmal dilatation is noted. No cardiac enlargement is seen. Mild coronary calcifications are noted. Pulmonary artery as visualized is within normal limits. Mediastinum/Nodes: Thoracic inlet is within normal limits. Teresia Myint few scattered mediastinal lymph nodes are noted largest of which measuring up to 10 mm in short axis in the right paratracheal region. The esophagus as visualized is within normal limits. Lungs/Pleura: Lungs  are well aerated bilaterally. Dependent atelectatic changes are seen bilaterally. No focal parenchymal infiltrate or effusion is noted. No parenchymal nodules are seen. Upper Abdomen: Visualized upper abdomen shows no acute abnormality. Musculoskeletal: Degenerative changes of the thoracic spine are noted. No acute rib abnormality is seen. IMPRESSION: Mild atelectatic changes without focal confluent infiltrate. Scattered mediastinal nodes measuring up to 1 cm in short axis likely reactive in nature. No other acute abnormality is noted. Aortic Atherosclerosis (ICD10-I70.0). Electronically Signed   By: Inez Catalina M.D.   On: 04/28/2022 00:46   DG Chest 2 View  Result Date: 04/27/2022 CLINICAL DATA:  Chest pain EXAM: CHEST - 2 VIEW COMPARISON:  04/12/2022 FINDINGS: Cardiac size is within normal limits. There are no signs of pulmonary edema. There are small linear densities in the lower lung fields suggesting scarring or subsegmental atelectasis. There is minimal blunting of lateral CP angles. There is no pneumothorax. IMPRESSION: Small linear densities in the lower lung fields suggest scarring or subsegmental atelectasis. There are no signs of pulmonary edema or focal pulmonary consolidation. Electronically Signed   By: Elmer Picker M.D.   On: 04/27/2022 14:48   DG Chest 2 View  Result Date: 04/12/2022 CLINICAL DATA:  Central chest pain for 2 days. EXAM: CHEST - 2 VIEW COMPARISON:  None Available. FINDINGS: The heart size and mediastinal contours are within normal limits. Both lungs are clear. The visualized skeletal structures are unremarkable. IMPRESSION: No active cardiopulmonary disease. Electronically Signed   By: Marlaine Hind M.D.   On: 04/12/2022 11:46    Microbiology: Recent Results (from the past 240 hour(s))  SARS Coronavirus 2 by RT PCR (hospital order, performed in [facility] hospital lab) *cepheid single result test* Anterior Nasal Swab     Status: None   Collection Time: 04/27/22  10:30 PM   Specimen: Anterior Nasal Swab  Result Value Ref Range Status   SARS Coronavirus 2 by RT PCR NEGATIVE NEGATIVE Final    Comment: (NOTE) SARS-CoV-2 target nucleic acids are NOT DETECTED.  The SARS-CoV-2 RNA is generally detectable in upper and lower respiratory specimens during the acute phase of infection. The lowest concentration of SARS-CoV-2 viral copies this assay can detect is 250 copies / mL. Jaden Abreu negative result does not preclude SARS-CoV-2 infection and should not be used as the sole basis for treatment or other patient management decisions.  Leotha Voeltz negative result may occur  with improper specimen collection / handling, submission of specimen other than nasopharyngeal swab, presence of viral mutation(s) within the areas targeted by this assay, and inadequate number of viral copies (<250 copies / mL). Lowella Kindley negative result must be combined with clinical observations, patient history, and epidemiological information.  Fact Sheet for Patients:   https://www.patel.info/  Fact Sheet for Healthcare Providers: https://hall.com/  This test is not yet approved or  cleared by the [place] FDA and has been authorized for detection and/or diagnosis of SARS-CoV-2 by FDA under an Emergency Use Authorization (EUA).  This EUA will remain in effect (meaning this test can be used) for the duration of the COVID-19 declaration under Section 564(b)(1) of the Act, 21 U.S.C. section 360bbb-3(b)(1), unless the authorization is terminated or revoked sooner.  Performed at [facility], [place] [address], [place], [place] [address]      Labs: Basic Metabolic Panel: Recent Labs  Lab 04/27/22 1419 04/28/22 0310  NA 139 138  K 3.5 3.9  CL 108 107  CO2 22 22  GLUCOSE 171* 108*  BUN 19 20  CREATININE 2.20* 2.05*  CALCIUM 8.9 8.9  MG  --  2.0  PHOS  --  3.2   Liver Function Tests: Recent Labs  Lab 04/28/22 0310  AST 28  ALT 26   ALKPHOS 30*  BILITOT 0.6  PROT 6.0*  ALBUMIN 2.9*   No results for input(s): "LIPASE", "AMYLASE" in the last 168 hours. No results for input(s): "AMMONIA" in the last 168 hours. CBC: Recent Labs  Lab 04/27/22 1419 04/28/22 0310  WBC 15.2* 14.2*  NEUTROABS  --  12.0*  HGB 9.9* 9.5*  HCT 29.8* 29.8*  MCV 89.0 90.3  PLT 422* 408*   Cardiac Enzymes: No results for input(s): "CKTOTAL", "CKMB", "CKMBINDEX", "TROPONINI" in the last 168 hours. BNP: BNP (last 3 results) Recent Labs    04/27/22 1419  BNP 298.6*    ProBNP (last 3 results) No results for input(s): "PROBNP" in the last 8760 hours.  CBG: No results for input(s): "GLUCAP" in the last 168 hours.     Signed:  Fayrene Helper MD.  [facility] 04/28/2022, 5:35 PM

## 2022-04-28 NOTE — H&P (Signed)
History and Physical  Eddie Hutchinson QIH:[id number] DOB: 07-Sep-1941 DOA: 04/27/2022  Referring physician: Kelli Churn  PCP: [name], [place]  Outpatient Specialists: Cardiology Patient coming from: Home  Chief Complaint: Chest pain   HPI: Eddie Hutchinson is a 81 y.o. male with medical history significant for hypertension, hyperlipidemia, nephrolithiasis, family history of premature coronary artery disease, who presented to [facility] ED from home due to constant nonexertional chest pain x2 days, sharp in nature centrally located, non-radiating.  His chest pain is worse with laying flat, supine, and improved with sitting up and leaning forward.  His pain level goes from 9 out of 10 lying supine to nearly 0 out of 10 leaning forward.  Denies dyspnea or cough.  Not hypoxic.    In the ED, work-up revealed negative high-sensitivity troponin x2.  D-dimer mildly elevated 0.86.  Chest x-ray nonacute.  The patient received a full dose aspirin 324 mg x 1 and analgesics.  [facility], hospitalist service, was asked to admit.  ED Course: Tmax 100.3.  BP 135/55, pulse 58, respiration rate 18, saturation 99% on 2 L.  He was placed on 2 L for comfort per EDP.  Lab studies remarkable for creatinine 2.05, at baseline.  GFR 32.  BNP 298.  WBC 14.2.  Hemoglobin 9.5.  Platelet count 408.  Sed rate 75.  CRP 16.3.  Review of Systems: Review of systems as noted in the HPI. All other systems reviewed and are negative.   Past Medical History:  Diagnosis Date   Abnormal EKG 11/24/2016   LAFB with LVH and QRS widening   Benign essential HTN 11/24/2016   Family history of early CAD 11/24/2016   Glucose intolerance (impaired glucose tolerance)    Hyperlipidemia    Hyperlipidemia 11/24/2016   Hypertension    Kidney stone    PVC's (premature ventricular contractions) 11/24/2016   Vitamin D deficiency    Past Surgical History:  Procedure Laterality Date   CYSTOSCOPY/RETROGRADE/URETEROSCOPY/STONE EXTRACTION WITH BASKET      KIDNEY STONE SURGERY     LITHOTRIPSY     TOOTH EXTRACTION Left 01/2021    Social History:  reports that he has quit smoking. He has never used smokeless tobacco. He reports current alcohol use. He reports that he does not use drugs.   Allergies  Allergen Reactions   Bee Venom Hives    As a child     Family History  Problem Relation Age of Onset   Heart failure Mother    Heart attack Father 64   Heart disease Father    CVA Brother    Cancer Brother       Prior to Admission medications   Medication Sig Start Date End Date Taking? Authorizing Provider  amLODipine (NORVASC) 10 MG tablet TAKE 1 TABLET BY MOUTH EVERY DAY Patient taking differently: Take 10 mg by mouth daily. 12/12/21  Yes [name], FNP  Ergocalciferol (VITAMIN D2 PO) Take 1,000 Units by mouth daily.    Yes [provider]  fenofibrate 160 MG tablet TAKE 1 TABLET BY MOUTH EVERY DAY Patient taking differently: Take 160 mg by mouth daily. 01/29/22  Yes [name], FNP  ferrous sulfate 325 (65 FE) MG tablet TAKE 1 TABLET BY MOUTH EVERY DAY Patient taking differently: Take 325 mg by mouth daily. 02/11/22  Yes [name], FNP  furosemide (LASIX) 20 MG tablet Take 2 tablets (40 mg total) by mouth daily. Patient taking differently: Take 20 mg by mouth See admin instructions. 3 times a  week M,W,F 07/21/21  Yes Nahser, Wonda Cheng, MD  hydrALAZINE (APRESOLINE) 100 MG tablet Take 100 mg by mouth 3 (three) times daily.   Yes [provider]  isosorbide mononitrate (IMDUR) 30 MG 24 hr tablet Take 1 tablet by mouth daily. 07/01/20  Yes [provider]  losartan (COZAAR) 25 MG tablet Take 25 mg by mouth daily. 03/31/22  Yes [provider]  triamcinolone ointment (KENALOG) 0.5 % Apply 1 application. topically 2 (two) times daily. 04/09/22  Yes [name], FNP  predniSONE (DELTASONE) 20 MG tablet Take 2 tablets (40 mg total) by mouth daily with breakfast. For the next four days Patient not  taking: Reported on 04/28/2022 04/12/22   Carmin Muskrat, MD    Physical Exam: BP (!) 135/55   Pulse (!) 58   Temp 98.5 F (36.9 C) (Oral)   Resp 18   Ht _0  (1.727 m)   Wt 80.3 kg   SpO2 99%   BMI 26.91 kg/m   General: 81 y.o. year-old male well developed well nourished in no acute distress.  Alert and oriented x3. Cardiovascular: Regular rate and rhythm with no rubs or gallops.  No thyromegaly or JVD noted.  No lower extremity edema. 2/4 pulses in all 4 extremities.  His chest pain is improved when the patient is moved and leaning forward. Respiratory: Clear to auscultation with no wheezes or rales. Good inspiratory effort. Abdomen: Soft nontender nondistended with normal bowel sounds x4 quadrants. Muskuloskeletal: No cyanosis, clubbing or edema noted bilaterally Neuro: CN II-XII intact, strength, sensation, reflexes Skin: No ulcerative lesions noted or rashes Psychiatry: Judgement and insight appear normal. Mood is appropriate for condition and setting          Labs on Admission:  Basic Metabolic Panel: Recent Labs  Lab 04/27/22 1419 04/28/22 0310  NA 139 138  K 3.5 3.9  CL 108 107  CO2 22 22  GLUCOSE 171* 108*  BUN 19 20  CREATININE 2.20* 2.05*  CALCIUM 8.9 8.9  MG  --  2.0  PHOS  --  3.2   Liver Function Tests: Recent Labs  Lab 04/28/22 0310  AST 28  ALT 26  ALKPHOS 30*  BILITOT 0.6  PROT 6.0*  ALBUMIN 2.9*   No results for input(s): "LIPASE", "AMYLASE" in the last 168 hours. No results for input(s): "AMMONIA" in the last 168 hours. CBC: Recent Labs  Lab 04/27/22 1419 04/28/22 0310  WBC 15.2* 14.2*  NEUTROABS  --  12.0*  HGB 9.9* 9.5*  HCT 29.8* 29.8*  MCV 89.0 90.3  PLT 422* 408*   Cardiac Enzymes: No results for input(s): "CKTOTAL", "CKMB", "CKMBINDEX", "TROPONINI" in the last 168 hours.  BNP (last 3 results) Recent Labs    04/27/22 1419  BNP 298.6*    ProBNP (last 3 results) No results for input(s): "PROBNP" in the last 8760  hours.  CBG: No results for input(s): "GLUCAP" in the last 168 hours.  Radiological Exams on Admission: CT Chest Wo Contrast  Result Date: 04/28/2022 CLINICAL DATA:  Chest pain and shortness of breath EXAM: CT CHEST WITHOUT CONTRAST TECHNIQUE: Multidetector CT imaging of the chest was performed following the standard protocol without IV contrast. RADIATION DOSE REDUCTION: This exam was performed according to the departmental dose-optimization program which includes automated exposure control, adjustment of the mA and/or kV according to patient size and/or use of iterative reconstruction technique. COMPARISON:  Chest x-ray from the previous day. FINDINGS: Cardiovascular: Somewhat limited due to lack of IV contrast.  Atherosclerotic calcifications of the aorta are seen. No aneurysmal dilatation is noted. No cardiac enlargement is seen. Mild coronary calcifications are noted. Pulmonary artery as visualized is within normal limits. Mediastinum/Nodes: Thoracic inlet is within normal limits. A few scattered mediastinal lymph nodes are noted largest of which measuring up to 10 mm in short axis in the right paratracheal region. The esophagus as visualized is within normal limits. Lungs/Pleura: Lungs are well aerated bilaterally. Dependent atelectatic changes are seen bilaterally. No focal parenchymal infiltrate or effusion is noted. No parenchymal nodules are seen. Upper Abdomen: Visualized upper abdomen shows no acute abnormality. Musculoskeletal: Degenerative changes of the thoracic spine are noted. No acute rib abnormality is seen. IMPRESSION: Mild atelectatic changes without focal confluent infiltrate. Scattered mediastinal nodes measuring up to 1 cm in short axis likely reactive in nature. No other acute abnormality is noted. Aortic Atherosclerosis (ICD10-I70.0). Electronically Signed   By: Inez Catalina M.D.   On: 04/28/2022 00:46   DG Chest 2 View  Result Date: 04/27/2022 CLINICAL DATA:  Chest pain EXAM:  CHEST - 2 VIEW COMPARISON:  04/12/2022 FINDINGS: Cardiac size is within normal limits. There are no signs of pulmonary edema. There are small linear densities in the lower lung fields suggesting scarring or subsegmental atelectasis. There is minimal blunting of lateral CP angles. There is no pneumothorax. IMPRESSION: Small linear densities in the lower lung fields suggest scarring or subsegmental atelectasis. There are no signs of pulmonary edema or focal pulmonary consolidation. Electronically Signed   By: Elmer Picker M.D.   On: 04/27/2022 14:48    EKG: I independently viewed the EKG done and my findings are as followed: Sinus rhythm rate of 59.  ST elevations suggestive of acute pericarditis.  Assessment/Plan Present on Admission:  Chest pain  Principal Problem:   Chest pain  Chest pain, suspect acute pericarditis The patient presents with nonexertional chest pain constant for 2 days improved with leaning forward ESR and CRP elevated EKG suggestive of acute pericarditis 2D echo ordered and pending Please consult cardiology in the morning Started colchicine 0.6 mg twice daily Consider adding ibuprofen 600 mg 3 times daily if no contraindications. Added p.o. Protonix 40 mg daily as GI prophylaxis  Chronic normocytic anemia, anemia of chronic disease, iron deficiency anemia Hemoglobin 9.5K from 9.9K No overt bleeding Continue home ferrous sulfate Continue to monitor H&H  CKD 3B Appears to be at his baseline creatinine 2.05 with GFR of 32 Avoid nephrotoxic agents and hypotension Monitor urine output  Hypertension BP is at goal Continue home hydralazine and Imdur  Hyperlipidemia Continue home fenofibrate  HFpEF 60 to 65% Resume home cardiac medications Strict I's and O's and daily weight Follow repeat 2D echo   Critical care time: 65 minutes.    DVT prophylaxis: Subcu heparin 3 times daily  Code Status: Full code  Family Communication: Daughter at  bedside  Disposition Plan: Admitted to telemetry cardiac unit  Consults called: Please consult cardiology in the morning  Admission status: Inpatient status.   Status is: Inpatient The patient requires at least 2 midnights for further evaluation and treatment of present condition.   Kayleen Memos MD [facility] Pager [phone]  If 7PM-7AM, please contact night-coverage [email or web address] Password [email or web address]  04/28/2022, 6:15 AM

## 2022-04-28 NOTE — Consult Note (Addendum)
Cardiology Consultation:   Patient ID: Eddie Hutchinson MRN: [id number]; DOB: [birth year]  Admit date: 04/27/2022 Date of Consult: 04/28/2022  PCP:  [name], [facility] Cardiologist:  Mertie Moores, MD  07/21/2021  Patient Profile:   Eddie Hutchinson is a 81 y.o. 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  History of Present Illness:   Eddie Hutchinson had pain that started Saturday night w/ exertion, using a floor buffer. 8/10, did not take any meds, decreased slightly with rest. Sunday, continued to have the pain at about the same level.  On Monday, pain was more severe, worse w/ lying down and better w/ sitting up and leaning forward. No SOB, no N&V. Sx worsened w/out any activity. That is what made him come to the hospital.  In the ER, he was put on colchicine and prednisone, sx have improved.   Sx have improved to a 2/10, made worse by taking a deep breath, certain movements or lying flat.  Has never had pain like this before.   He normally plays golf, mows the yard and washes the cars, without sx.   He will get ankle swelling during the day. No orthopnea or PND. Nio change in the amount of exertion it takes to make him SOB.   Sx remind him of the pain he had when he went to the ER 04/12/2022, he was treated w/ steroids and the pain resolved.    Past Medical History:  Diagnosis Date   Abnormal EKG 11/24/2016   LAFB with LVH and QRS widening   Benign essential HTN 11/24/2016   Family history of early CAD 11/24/2016   Glucose intolerance (impaired glucose tolerance)    Hyperlipidemia 11/24/2016   Hypertension    Kidney stone    PVC's (premature ventricular contractions) 11/24/2016   Vitamin D deficiency     Past Surgical History:  Procedure Laterality Date   CYSTOSCOPY/RETROGRADE/URETEROSCOPY/STONE EXTRACTION WITH BASKET      KIDNEY STONE SURGERY     LITHOTRIPSY     TOOTH EXTRACTION Left 01/2021     Home Medications:  Prior to Admission medications   Medication Sig Start Date End Date Taking? Authorizing Provider  amLODipine (NORVASC) 10 MG tablet TAKE 1 TABLET BY MOUTH EVERY DAY Patient taking differently: Take 10 mg by mouth daily. 12/12/21  Yes [name], FNP  Ergocalciferol (VITAMIN D2 PO) Take 1,000 Units by mouth daily.    Yes [provider]  fenofibrate 160 MG tablet TAKE 1 TABLET BY MOUTH EVERY DAY Patient taking differently: Take 160 mg by mouth daily. 01/29/22  Yes [name], FNP  ferrous sulfate 325 (65 FE) MG tablet TAKE 1 TABLET BY MOUTH EVERY DAY Patient taking differently: Take 325 mg by mouth daily. 02/11/22  Yes [name], FNP  furosemide (LASIX) 20 MG tablet Take 2 tablets (40 mg total) by mouth daily. Patient taking differently: Take 20 mg by mouth See admin instructions. 3 times a week M,W,F 07/21/21  Yes Nahser, Wonda Cheng, MD  hydrALAZINE (APRESOLINE) 100 MG tablet Take 100 mg by mouth 3 (three) times daily.   Yes [provider]  isosorbide mononitrate (IMDUR) 30 MG 24 hr tablet Take 1 tablet by mouth daily. 07/01/20  Yes [provider]  losartan (COZAAR) 25 MG tablet Take 25 mg by mouth daily. 03/31/22  Yes  [provider]  triamcinolone ointment (KENALOG) 0.5 % Apply 1 application. topically 2 (two) times daily. 04/09/22  Yes [name], FNP  predniSONE (DELTASONE) 20 MG tablet Take 2 tablets (40 mg total) by mouth daily with breakfast. For the next four days Patient not taking: Reported on 04/28/2022 04/12/22   Carmin Muskrat, MD    Inpatient Medications: Scheduled Meds:  cholecalciferol  1,000 Units Oral Daily   colchicine  0.6 mg Oral BID   fenofibrate  160 mg Oral Daily   ferrous sulfate  325 mg Oral Daily   heparin  5,000 Units Subcutaneous Q8H   hydrALAZINE  100 mg Oral TID   isosorbide mononitrate  30 mg Oral Daily   pantoprazole  40  mg Oral Daily   predniSONE  40 mg Oral Q breakfast   Continuous Infusions:  PRN Meds: acetaminophen, HYDROmorphone (DILAUDID) injection, melatonin, nitroGLYCERIN, ondansetron (ZOFRAN) IV, oxyCODONE, polyethylene glycol  Allergies:    Allergies  Allergen Reactions   Bee Venom Hives    As a child     Social History:   Social History   Socioeconomic History   Marital status: Widowed    Spouse name: Not on file   Number of children: Not on file   Years of education: Not on file   Highest education level: Not on file  Occupational History   Occupation: retired  Tobacco Use   Smoking status: Former   Smokeless tobacco: Never  Scientific laboratory technician Use: Never used  Substance and Sexual Activity   Alcohol use: Yes    Comment: occassionally   Drug use: No   Sexual activity: Not Currently  Other Topics Concern   Not on file  Social History Narrative   Not on file   Social Determinants of Health   Financial Resource Strain: Low Risk  (02/26/2022)   Overall Financial Resource Strain (CARDIA)    Difficulty of Paying Living Expenses: Not hard at all  Food Insecurity: No Food Insecurity (02/26/2022)   Hunger Vital Sign    Worried About Running Out of Food in the Last Year: Never true    [place] in the Last Year: Never true  Transportation Needs: No Transportation Needs (02/26/2022)   PRAPARE - Hydrologist (Medical): No    Lack of Transportation (Non-Medical): No  Physical Activity: Inactive (02/26/2022)   Exercise Vital Sign    Days of Exercise per Week: 0 days    Minutes of Exercise per Session: 0 min  Stress: No Stress Concern Present (02/26/2022)   [place]    Feeling of Stress : Not at all  Social Connections: Not on file  Intimate Partner Violence: Not At Risk (05/30/2019)   Humiliation, Afraid, Rape, and Kick questionnaire    Fear of Current or Ex-Partner: No     Emotionally Abused: No    Physically Abused: No    Sexually Abused: No    Family History:   Family History  Problem Relation Age of Onset   Heart failure Mother    Heart attack Father 42   Heart disease Father    CVA Brother    Cancer Brother      ROS:  Please see the history of present illness.  All other ROS reviewed and negative.     Physical Exam/Data:   Vitals:   04/28/22 1245 04/28/22 1300 04/28/22 1315 04/28/22 1330  BP: (!) 138/55 (!) 151/53 Marland Kitchen)  133/54 (!) 125/57  Pulse: 67 66 66 80  Resp: (!) 24 17 (!) 22 (!) 26  Temp:      TempSrc:      SpO2: 98% 98% 97% 96%  Weight:      Height:        Intake/Output Summary (Last 24 hours) at 04/28/2022 1357 Last data filed at 04/28/2022 0332 Gross per 24 hour  Intake --  Output 200 ml  Net -200 ml      04/27/2022    2:10 PM 04/12/2022   12:13 PM 04/09/2022   12:09 PM  Last 3 Weights  Weight (lbs) 177 lb 177 lb 177 lb  Weight (kg) 80.287 kg 80.287 kg 80.287 kg     Body mass index is 26.91 kg/m.  General:  Well nourished, well developed, in no acute distress HEENT: normal for age, poor dentition Neck: no JVD Vascular: No carotid bruits; Distal pulses 2+ bilaterally Cardiac:  normal S1, S2; RRR; 2/6 murmur, +rub LLSB Lungs:few rales bases bilaterally, no wheezing, rhonchi or rales  Abd: soft, nontender, no hepatomegaly  Ext: no edema Musculoskeletal:  No deformities, BUE and BLE strength normal and equal Skin: warm and dry  Neuro:  CNs 2-12 intact, no focal abnormalities noted Psych:  Normal affect   EKG:  The EKG was personally reviewed and demonstrates:  SR, HR 59, Septal and anterior ST elevation, no reciprocal changes, QRS duration 124 ms w/ NSIVC defect (old), ?pericarditis Telemetry:  Telemetry was personally reviewed and demonstrates:  SR, Sinus brady  Relevant CV Studies:  ECHO: 04/27/2022  1. Left ventricular ejection fraction, by estimation, is 55 to 60%. The  left ventricle has normal function. The  left ventricle has no regional  wall motion abnormalities. Left ventricular diastolic parameters are  consistent with Grade I diastolic dysfunction (impaired relaxation).   2. Right ventricular systolic function is normal. The right ventricular  size is normal. Tricuspid regurgitation signal is inadequate for assessing  PA pressure.   3. The mitral valve is normal in structure. Trivial mitral valve  regurgitation. No evidence of mitral stenosis.   4. The aortic valve is tricuspid. There is mild calcification of the  aortic valve. Aortic valve regurgitation is not visualized.   5. The inferior vena cava is normal in size with greater than 50%  respiratory variability, suggesting right atrial pressure of 3 mmHg.   Comparison(s): Similar to 2021 report.   MYOVIEW: 12/10/2016 Nuclear stress EF: 51%. There was no ST segment deviation noted during stress. Defect 1: There is a medium defect of moderate severity present in the basal inferior and mid inferior location. This is a low risk study. The left ventricular ejection fraction is mildly decreased (45-54%).   Low risk stress nuclear study with inferior thinning but no ischemia; EF low normal (51) with normal wall motion.  Laboratory Data:  High Sensitivity Troponin:   Recent Labs  Lab 04/12/22 1118 04/12/22 1316 04/27/22 1419 04/27/22 1624  TROPONINIHS _0 Chemistry Recent Labs  Lab 04/27/22 1419 04/28/22 0310  NA 139 138  K 3.5 3.9  CL 108 107  CO2 22 22  GLUCOSE 171* 108*  BUN 19 20  CREATININE 2.20* 2.05*  CALCIUM 8.9 8.9  MG  --  2.0  GFRNONAA 30* 32*  ANIONGAP 9 9    Recent Labs  Lab 04/28/22 0310  PROT 6.0*  ALBUMIN 2.9*  AST 28  ALT 26  ALKPHOS 30*  BILITOT 0.6  Lipids No results for input(s): "CHOL", "TRIG", "HDL", "LABVLDL", "LDLCALC", "CHOLHDL" in the last 168 hours.  Hematology Recent Labs  Lab 04/27/22 1419 04/28/22 0310  WBC 15.2* 14.2*  RBC 3.35* 3.30*  HGB 9.9* 9.5*  HCT  29.8* 29.8*  MCV 89.0 90.3  MCH 29.6 28.8  MCHC 33.2 31.9  RDW 16.4* 16.2*  PLT 422* 408*   Thyroid No results for input(s): "TSH", "FREET4" in the last 168 hours.  BNP Recent Labs  Lab 04/27/22 1419  BNP 298.6*    DDimer  Recent Labs  Lab 04/27/22 2305  DDIMER 0.84*   No results found for: "CKTOTAL", "CKMB", "CKMBINDEX", "TROPONINI" No results found for: "ANA" Lab Results  Component Value Date   ESRSEDRATE 75 (H) 04/28/2022     Radiology/Studies:  ECHOCARDIOGRAM COMPLETE  Result Date: 04/28/2022    ECHOCARDIOGRAM REPORT   Patient Name:   Eddie Hutchinson Date of Exam: 04/28/2022 Medical Rec #:  [id number]       Height:       68.0 in Accession #:    [id number]      Weight:       177.0 lb Date of Birth:  Dec 19, 1940       BSA:          1.940 m Patient Age:    72 years        BP:           151/57 mmHg Patient Gender: M               HR:           67 bpm. Exam Location:  Inpatient Procedure: 2D Echo, Cardiac Doppler and Color Doppler Indications:    Chest pain  History:        Patient has prior history of Echocardiogram examinations. Risk                 Factors:Hypertension.  Sonographer:    Jyl Heinz Referring Phys: [id number] [place]  1. Left ventricular ejection fraction, by estimation, is 55 to 60%. The left ventricle has normal function. The left ventricle has no regional wall motion abnormalities. Left ventricular diastolic parameters are consistent with Grade I diastolic dysfunction (impaired relaxation).  2. Right ventricular systolic function is normal. The right ventricular size is normal. Tricuspid regurgitation signal is inadequate for assessing PA pressure.  3. The mitral valve is normal in structure. Trivial mitral valve regurgitation. No evidence of mitral stenosis.  4. The aortic valve is tricuspid. There is mild calcification of the aortic valve. Aortic valve regurgitation is not visualized.  5. The inferior vena cava is normal in size with greater than 50%  respiratory variability, suggesting right atrial pressure of 3 mmHg. Comparison(s): Similar to 2021 report. FINDINGS  Left Ventricle: Left ventricular ejection fraction, by estimation, is 55 to 60%. The left ventricle has normal function. The left ventricle has no regional wall motion abnormalities. The left ventricular internal cavity size was normal in size. There is  no left ventricular hypertrophy. Left ventricular diastolic parameters are consistent with Grade I diastolic dysfunction (impaired relaxation). Right Ventricle: The right ventricular size is normal. No increase in right ventricular wall thickness. Right ventricular systolic function is normal. Tricuspid regurgitation signal is inadequate for assessing PA pressure. Left Atrium: Left atrial size was normal in size. Right Atrium: Right atrial size was normal in size. Pericardium: There is no evidence of pericardial effusion. Mitral Valve: The mitral valve is normal in structure.  Trivial mitral valve regurgitation. No evidence of mitral valve stenosis. Tricuspid Valve: The tricuspid valve is normal in structure. Tricuspid valve regurgitation is not demonstrated. No evidence of tricuspid stenosis. Aortic Valve: The aortic valve is tricuspid. There is mild calcification of the aortic valve. Aortic valve regurgitation is not visualized. Aortic valve peak gradient measures 13.8 mmHg. Pulmonic Valve: The pulmonic valve was normal in structure. Pulmonic valve regurgitation is mild to moderate. No evidence of pulmonic stenosis. Aorta: The aortic root and ascending aorta are structurally normal, with no evidence of dilitation. Pulmonary Artery: The pulmonary artery is of normal size. Venous: The inferior vena cava is normal in size with greater than 50% respiratory variability, suggesting right atrial pressure of 3 mmHg. IAS/Shunts: No atrial level shunt detected by color flow Doppler.  LEFT VENTRICLE PLAX 2D LVIDd:         5.50 cm      Diastology LVIDs:          3.80 cm      LV e' medial:    8.70 cm/s LV PW:         1.00 cm      LV E/e' medial:  11.6 LV IVS:        1.00 cm      LV e' lateral:   9.03 cm/s LVOT diam:     2.00 cm      LV E/e' lateral: 11.2 LV SV:         85 LV SV Index:   44 LVOT Area:     3.14 cm  LV Volumes (MOD) LV vol d, MOD A2C: 120.0 ml LV vol d, MOD A4C: 147.0 ml LV vol s, MOD A2C: 49.8 ml LV vol s, MOD A4C: 62.4 ml LV SV MOD A2C:     70.2 ml LV SV MOD A4C:     147.0 ml LV SV MOD BP:      78.3 ml RIGHT VENTRICLE             IVC RV Basal diam:  3.10 cm     IVC diam: 1.80 cm RV Mid diam:    3.70 cm RV S prime:     11.90 cm/s TAPSE (M-mode): 2.2 cm LEFT ATRIUM             Index        RIGHT ATRIUM           Index LA diam:        3.90 cm 2.01 cm/m   RA Area:     13.70 cm LA Vol (A2C):   60.0 ml 30.92 ml/m  RA Volume:   32.00 ml  16.49 ml/m LA Vol (A4C):   54.5 ml 28.09 ml/m LA Biplane Vol: 58.4 ml 30.10 ml/m  AORTIC VALVE AV Area (Vmax): 2.33 cm AV Vmax:        186.00 cm/s AV Peak Grad:   13.8 mmHg LVOT Vmax:      138.00 cm/s LVOT Vmean:     92.700 cm/s LVOT VTI:       0.272 m  AORTA Ao Root diam: 3.40 cm Ao Asc diam:  2.80 cm MITRAL VALVE MV Area (PHT): 4.17 cm     SHUNTS MV Decel Time: 182 msec     Systemic VTI:  0.27 m MV E velocity: 101.00 cm/s  Systemic Diam: 2.00 cm MV A velocity: 131.00 cm/s MV E/A ratio:  0.77 Rudean Haskell MD Electronically signed by Rudean Haskell MD Signature Date/Time: 04/28/2022/11:43:42 AM  Final    NM Pulmonary Perfusion  Result Date: 04/28/2022 CLINICAL DATA:  [place] EXAM: NUCLEAR MEDICINE PERFUSION LUNG SCAN TECHNIQUE: Perfusion images were obtained in multiple projections after intravenous injection of radiopharmaceutical. RADIOPHARMACEUTICALS:  4.0 mCi Tc-39mMAA COMPARISON:  CT chest 04/28/2022 FINDINGS: Single small peripheral perfusion defect within the posterior LEFT upper lobe. No additional peripheral perfusion defects within LEFT or RIGHT lung. IMPRESSION: Low probability for acute  pulmonary embolism. Electronically Signed   By: SSuzy BouchardM.D.   On: 04/28/2022 11:01   CT Chest Wo Contrast  Result Date: 04/28/2022 CLINICAL DATA:  Chest pain and shortness of breath EXAM: CT CHEST WITHOUT CONTRAST TECHNIQUE: Multidetector CT imaging of the chest was performed following the standard protocol without IV contrast. RADIATION DOSE REDUCTION: This exam was performed according to the departmental dose-optimization program which includes automated exposure control, adjustment of the mA and/or kV according to patient size and/or use of iterative reconstruction technique. COMPARISON:  Chest x-ray from the previous day. FINDINGS: Cardiovascular: Somewhat limited due to lack of IV contrast. Atherosclerotic calcifications of the aorta are seen. No aneurysmal dilatation is noted. No cardiac enlargement is seen. Mild coronary calcifications are noted. Pulmonary artery as visualized is within normal limits. Mediastinum/Nodes: Thoracic inlet is within normal limits. A few scattered mediastinal lymph nodes are noted largest of which measuring up to 10 mm in short axis in the right paratracheal region. The esophagus as visualized is within normal limits. Lungs/Pleura: Lungs are well aerated bilaterally. Dependent atelectatic changes are seen bilaterally. No focal parenchymal infiltrate or effusion is noted. No parenchymal nodules are seen. Upper Abdomen: Visualized upper abdomen shows no acute abnormality. Musculoskeletal: Degenerative changes of the thoracic spine are noted. No acute rib abnormality is seen. IMPRESSION: Mild atelectatic changes without focal confluent infiltrate. Scattered mediastinal nodes measuring up to 1 cm in short axis likely reactive in nature. No other acute abnormality is noted. Aortic Atherosclerosis (ICD10-I70.0). Electronically Signed   By: MInez CatalinaM.D.   On: 04/28/2022 00:46   DG Chest 2 View  Result Date: 04/27/2022 CLINICAL DATA:  Chest pain EXAM: CHEST - 2 VIEW  COMPARISON:  04/12/2022 FINDINGS: Cardiac size is within normal limits. There are no signs of pulmonary edema. There are small linear densities in the lower lung fields suggesting scarring or subsegmental atelectasis. There is minimal blunting of lateral CP angles. There is no pneumothorax. IMPRESSION: Small linear densities in the lower lung fields suggest scarring or subsegmental atelectasis. There are no signs of pulmonary edema or focal pulmonary consolidation. Electronically Signed   By: PElmer PickerM.D.   On: 04/27/2022 14:48     Assessment and Plan:   Chest pain - ez neg MI despite > 48 hr pain - pain improved by colchicine and steroids - ECG w/ ST elevation, no reciprocal changes >> likely pericarditis -  EF nl w/ no WMA on echo, grade 1 dd.  - no real risk factors for PE, VQ scan low prob - ESR elevated, no ANA or CK performed - MD advise further eval, if cardiac MRI is indicated - if sx improved and no further workup, discuss if he could be d/c'd  2. HTN - pt says BP meds recently increased, on med review, Lasix was increased from 20> 40 mg qd on 07/21/2021.   - says BP has been ok, at PCP visit 06/01, BP was 132/70 - not on amlodipine 10 mg qd at this time, but BP is pretty well controlled, continue home rx  as BP will permit   3. CKD III - He follows with Dr. Harrie Jeans - Nephrologist  - has been on losartan for a long time - Cr about at his baseline - per IM   Risk Assessment/Risk Scores:      For questions or updates, please contact [place] Please consult [email or web address] for contact info under    Signed, Rosaria Ferries, PA-C  04/28/2022 1:57 PM  History and all data above reviewed.  Patient examined.  I agree with the findings as above.   The patient presented last week with chest pain similar to the pain today.  This is a sharp pain worse lying flat.  8/10.  Worse with inspiration.  Never had this before.  Currently mild discomfort.  Not with exertion.   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  Denies new SOB, cough, fevers or chills.  No PND or orthopnea.  The patient exam reveals COR:RRR  ,  Lungs: Clear  ,  Abd: Positive bowel sounds, no rebound no guarding, Ext No edema  .  All available labs, radiology testing, previous records reviewed. Agree with documented assessment and plan.   Chest pain:  Although no objective findings on EKG, echo or exam the history is consistent with pericarditis.  I think that it would be reasonable to continue a taper over about 10 days with colchicine and a 7 day steroid taper.   We could see him back in follow up to make sure that he has complete resolution of pain .   I agree with not using NSAIDs given his CKD.     Jeneen Rinks Aliena Ghrist  4:11 PM  04/28/2022

## 2022-04-28 NOTE — ED Notes (Signed)
Secure chat sent to Dr. Florene Glen regarding patient ambulation. Pt ambulated 244 feet independently with no s/s distress on room air.

## 2022-04-29 ENCOUNTER — Other Ambulatory Visit: Payer: Self-pay | Admitting: Nurse Practitioner

## 2022-04-29 ENCOUNTER — Telehealth: Payer: Self-pay

## 2022-04-29 NOTE — Telephone Encounter (Signed)
Transition Care Management Follow-up Telephone Call Date of discharge and from where: 04/29/2022 [place] [facility]  How have you been since you were released from the hospital? Pt states he is feeling okay, he has no questions or concerns for PCP has a follow up appointment with cardiology.  Any questions or concerns? No  Items Reviewed: Did the pt receive and understand the discharge instructions provided? Yes  Medications obtained and verified? Yes  Other? Yes  Any new allergies since your discharge? No  Dietary orders reviewed? Yes Do you have support at home? Yes   Home Care and Equipment/Supplies: Were home health services ordered? no If so, what is the name of the agency? N/a  Has the agency set up a time to come to the patient's home? no Were any new equipment or medical supplies ordered?  No What is the name of the medical supply agency? N/a Were you able to get the supplies/equipment? no Do you have any questions related to the use of the equipment or supplies? No  Functional Questionnaire: (I = Independent and D = Dependent) ADLs: i  Bathing/Dressing- i  Meal Prep- i  Eating- i  Maintaining continence- i  Transferring/Ambulation- ii  Managing Meds- i  Follow up appointments reviewed:  PCP Hospital f/u appt confirmed? No  Scheduled to see n/a on n/a @ n/a. [facility] f/u appt confirmed? Yes  Scheduled to see cardiology  on 05/13/2022  @ [facility]. Are transportation arrangements needed? No  If their condition worsens, is the pt aware to call PCP or go to the Emergency Dept.? Yes Was the patient provided with contact information for the PCP's office or ED? Yes Was to pt encouraged to call back with questions or concerns? Yes

## 2022-05-11 NOTE — Progress Notes (Unsigned)
Cardiology Office Note:    Date:  05/13/2022   ID:  Eddie Hutchinson, DOB September 29, 1941, MRN [id number]  PCP:  Eddie Hutchinson, Eddie Hutchinson Providers Cardiologist:  Eddie Moores, MD     Referring MD: Eddie Brine, FNP   Chief Complaint: hospital follow-up pericarditis  History of Present Illness:    Eddie Hutchinson is a very pleasant 81 y.o. male with a hx of hypertension, hyperlipidemia, carotid bruit, left anterior fascicular block, CKD (seen by [facility])  Referred to cardiology by PCP for carotid bruit. He was last seen in our office by Dr. Acie Hutchinson on 07/21/21. Previously seen by Dr. Radford Hutchinson in 22018 with mild LAE on echo. Stress myoview 2018 showed no evidence of ischemia. No TIA or stroke symptoms. Carotid u/s revealed mild carotid artery disease duplex 07/2020. BP was elevated. He was not having good urine output on Lasix 20 mg so it was increased to 40 mg daily with plan for follow-up bmet and referral to hypertension clinic.   Admission 6/19-6/20/23 for pain that started with exertion while using a floor buffer 2 days prior to admission. On 6/19 pain was more severe, worse with lying down and better with sitting up and leaning forward. No SOB, no n/v, symptoms worsened without activity which prompted him to come to ED. In the ED, he was put on colchicine and prednisone and symptoms improved.  Cardiology was consulted. He reported previous episode of pain a few weeks prior treated with steroids and pain resolved. EKG revealed SR with HR 59 bpm, septal and anterior ST elevation, no reciprocal changes, QRS duration 124 ms with NSIVC defect (old), questionable pericarditis. Echo 04/27/22 revealed LVEF 55-60%, no rwma, G1DD, normal RV, mild calcification of aortic valve, no evidence of pericardial effusion. Troponin negative x 2, VQ revealed low probability for PE, elevated ESR, CRP, CT chest with mild atelectatic changes, mediastinal nodes up to 1 cm in short axis, likely  reactive. Presentation felt consistent with pericarditis despite no evidence on echo. Discharged on colchicine x 10 days, steroids x 7 days, avoiding NSAIDs due to CKD.    Today, he is here alone for follow-up. Reports he is feeling better since hospital discharge, has pushed mowed his yard the past 2 weeks without symptoms of chest discomfort. Avoids working outside in the heat. Notes mild bilateral LE edema on occasion. Frequent belching. He denies chest pain, shortness of breath, fatigue, palpitations, melena, hematuria, hemoptysis, diaphoresis, weakness, presyncope, syncope, orthopnea, and PND. Sees nephrologist August 22. Has home BP cuff, has not felt that readings were accurate but will monitor more consistently.   Past Medical History:  Diagnosis Date   Abnormal EKG 11/24/2016   LAFB with LVH and QRS widening   Benign essential HTN 11/24/2016   Family history of early CAD 11/24/2016   Glucose intolerance (impaired glucose tolerance)    Hyperlipidemia 11/24/2016   Hypertension    Kidney stone    PVC's (premature ventricular contractions) 11/24/2016   Vitamin D deficiency     Past Surgical History:  Procedure Laterality Date   CYSTOSCOPY/RETROGRADE/URETEROSCOPY/STONE EXTRACTION WITH BASKET     KIDNEY STONE SURGERY     LITHOTRIPSY     TOOTH EXTRACTION Left 01/2021    Current Medications: Current Meds  Medication Sig   amLODipine (NORVASC) 10 MG tablet TAKE 1 TABLET BY MOUTH EVERY DAY   carvedilol (COREG) 3.125 MG tablet Take 1 tablet (3.125 mg total) by mouth 2 (two) times daily.   Ergocalciferol (VITAMIN  D2 PO) Take 1,000 Units by mouth daily.    fenofibrate 160 MG tablet Take 1 tablet (160 mg total) by mouth daily.   ferrous sulfate 325 (65 FE) MG tablet TAKE 1 TABLET BY MOUTH EVERY DAY (Patient taking differently: Take 325 mg by mouth daily.)   furosemide (LASIX) 40 MG tablet Take 40 mg by mouth daily.   hydrALAZINE (APRESOLINE) 100 MG tablet Take 100 mg by mouth 3 (three)  times daily.   isosorbide mononitrate (IMDUR) 30 MG 24 hr tablet Take 1 tablet by mouth daily.   losartan (COZAAR) 25 MG tablet Take 25 mg by mouth daily.   pantoprazole (PROTONIX) 40 MG tablet Take 1 tablet (40 mg total) by mouth daily.   triamcinolone ointment (KENALOG) 0.5 % Apply 1 application. topically 2 (two) times daily.   [DISCONTINUED] furosemide (LASIX) 20 MG tablet Take 2 tablets (40 mg total) by mouth daily. (Patient taking differently: Take 20 mg by mouth See admin instructions. 3 times a week M,W,F)     Allergies:   Bee venom   Social History   Socioeconomic History   Marital status: Widowed    Spouse name: Not on file   Number of children: Not on file   Years of education: Not on file   Highest education level: Not on file  Occupational History   Occupation: retired  Tobacco Use   Smoking status: Former   Smokeless tobacco: Never  Scientific laboratory technician Use: Never used  Substance and Sexual Activity   Alcohol use: Yes    Comment: occassionally   Drug use: No   Sexual activity: Not Currently  Other Topics Concern   Not on file  Social History Narrative   Not on file   Social Determinants of Health   Financial Resource Strain: Low Risk  (02/26/2022)   Overall Financial Resource Strain (CARDIA)    Difficulty of Paying Living Expenses: Not hard at all  Food Insecurity: No Food Insecurity (02/26/2022)   Hunger Vital Sign    Worried About Running Out of Food in the Last Year: Never true    [place] in the Last Year: Never true  Transportation Needs: No Transportation Needs (02/26/2022)   PRAPARE - Hydrologist (Medical): No    Lack of Transportation (Non-Medical): No  Physical Activity: Inactive (02/26/2022)   Exercise Vital Sign    Days of Exercise per Week: 0 days    Minutes of Exercise per Session: 0 min  Stress: No Stress Concern Present (02/26/2022)   [place]    Feeling of Stress : Not at all  Social Connections: Not on file     Family History: The patient's family history includes CVA in his brother; Cancer in his brother; Heart attack (age of onset: 27) in his father; Heart disease in his father; Heart failure in his mother.  ROS:   Please see the history of present illness. All other systems reviewed and are negative.  Labs/Other Studies Reviewed:    The following studies were reviewed today:  Echo 04/28/22   1. Left ventricular ejection fraction, by estimation, is 55 to 60%. The  left ventricle has normal function. The left ventricle has no regional  wall motion abnormalities. Left ventricular diastolic parameters are  consistent with Grade I diastolic  dysfunction (impaired relaxation).   2. Right ventricular systolic function is normal. The right ventricular  size is normal. Tricuspid  regurgitation signal is inadequate for assessing  PA pressure.   3. The mitral valve is normal in structure. Trivial mitral valve  regurgitation. No evidence of mitral stenosis.   4. The aortic valve is tricuspid. There is mild calcification of the  aortic valve. Aortic valve regurgitation is not visualized.   5. The inferior vena cava is normal in size with greater than 50%  respiratory variability, suggesting right atrial pressure of 3 mmHg.   Comparison(s): Similar to 2021 report.   Vas Carotid 07/2020  Right Carotid: Velocities in the right ICA are consistent with a 1-39%  stenosis.   Left Carotid: Velocities in the left ICA are consistent with a 1-39%  stenosis. The ECA appears >50% stenosed.   Vertebrals:  Bilateral vertebral arteries demonstrate antegrade flow.  Subclavians: Left subclavian artery flow was disturbed. Normal flow  hemodynamics were seen in the right subclavian artery.   Leane Call 12/2016  Nuclear stress EF: 51%. There was no ST segment deviation noted during stress. Defect 1: There is a medium  defect of moderate severity present in the basal inferior and mid inferior location. This is a low risk study. The left ventricular ejection fraction is mildly decreased (45-54%).   Low risk stress nuclear study with inferior thinning but no ischemia; EF low normal (51) with normal wall motion.    Recent Labs: 04/27/2022: B Natriuretic Peptide 298.6 04/28/2022: ALT 26; BUN 20; Creatinine, Ser 2.05; Hemoglobin 9.5; Magnesium 2.0; Platelets 408; Potassium 3.9; Sodium 138  Recent Lipid Panel    Component Value Date/Time   CHOL 130 09/29/2021 0915   TRIG 57 09/29/2021 0915   HDL 50 09/29/2021 0915   CHOLHDL 2.6 09/29/2021 0915   LDLCALC 68 09/29/2021 0915     Risk Assessment/Calculations:      Physical Exam:    VS:  BP (!) 160/60 (BP Location: Left Arm, Patient Position: Sitting, Cuff Size: Normal)   Pulse 63   Ht 5' 8"  (1.727 m)   Wt 178 lb 3.2 oz (80.8 kg)   SpO2 99%   BMI 27.10 kg/m     Wt Readings from Last 3 Encounters:  05/13/22 178 lb 3.2 oz (80.8 kg)  04/27/22 177 lb (80.3 kg)  04/12/22 177 lb (80.3 kg)     GEN:  Well nourished, well developed in no acute distress HEENT: Normal NECK: No JVD; left carotid bruit CARDIAC: RRR, no murmurs, rubs, gallops RESPIRATORY:  Clear to auscultation without rales, wheezing or rhonchi  ABDOMEN: Soft, non-tender, non-distended MUSCULOSKELETAL:  No edema; No deformity. 2+ pedal pulses, equal bilaterally SKIN: Warm and dry NEUROLOGIC:  Alert and oriented x 3 PSYCHIATRIC:  Normal affect   EKG:  EKG is  ordered today.  The ekg ordered today demonstrates sinus rhythm at 63 bpm with occasional PVCs, new TWI V5, V6  Diagnoses:    1. Carotid bruit, unspecified laterality   2. Essential hypertension   3. Bruit of left carotid artery   4. Mixed hyperlipidemia   5. Abnormal electrocardiogram   6. Chest pain of uncertain etiology   7. T wave inversion on electrocardiogram    Assessment and Plan:     Chest pain/ Pericarditis: He  is feeling better since hospital discharge.  Completed colchicine and steroid prescriptions. No chest pain today. As noted below, he has new TWI on EKG. Will get lexiscan myoview to evaluate for ischemia.   New TWI on EKG: New TWI V5-V6 on EKG today. No wall motion abnormality, no LVH on echo  04/28/22.  Reviewed EKG with Dr. Acie Hutchinson. Patient not reporting chest pain today, however with recent hospitalization and new finding on EKG we will get [facility] for evaluation of ischemia.  He is not a candidate for coronary CTA due to CKD.  PVCs: Occasional PVCs on EKG today.  He is not aware of these, is asymptomatic. Normal LVEF 55-60% on echo 04/28/22. Will start low dose beta-blocker in the setting of ectopy and elevated BP. Have asked him to closely monitor HR and BP and notify us if HR consistently < 50 bpm or if he develops lightheadedness or other concerns.   Hypertension: BP is elevated today and remains elevated by my recheck.  He reports compliance with medications. Is followed closely by nephrology.  We will add carvedilol 3.125 mg twice daily. Advised him to continue consistent follow-up with nephrology.   Carotid stenosis: Left carotid bruit.  Mild (1-39%) bilateral carotid stenosis by ultrasound 07/2020. He is asymptomatic. We will repeat carotid duplex for evaluation of stenosis.   CKD: GFR 32, creatinine 2.05 on 04/28/22. Avoiding nephrotoxic agents with the exception of low dose ARB.  Continue close follow-up with nephrology.  Disposition: 6 months with Dr. Acie Hutchinson    Medication Adjustments/Labs and Tests Ordered: Current medicines are reviewed at length with the patient today.  Concerns regarding medicines are outlined above.  Orders Placed This Encounter  Procedures   Cardiac Stress Test: Informed Consent Details: Physician/Practitioner Attestation; Transcribe to consent form and obtain patient signature   Myocardial Perfusion Imaging   EKG 12-Lead   Meds ordered this encounter   Medications   carvedilol (COREG) 3.125 MG tablet    Sig: Take 1 tablet (3.125 mg total) by mouth 2 (two) times daily.    Dispense:  180 tablet    Refill:  3    Patient Instructions  Medication Instructions:   START Coreg one (1) tablet by mouth ( 3.125 mg) twice daily.    *If you need a refill on your cardiac medications before your next appointment, please call your pharmacy*   Lab Work:  None ordered.  If you have labs (blood work) drawn today and your tests are completely normal, you will receive your results only by: [place] (if you have MyChart) OR A paper copy in the mail If you have any lab test that is abnormal or we need to change your treatment, we will call you to review the results.   Testing/Procedures:  Your physician has requested that you have a carotid duplex. This test is an ultrasound of the carotid arteries in your neck. It looks at blood flow through these arteries that supply the brain with blood. Allow one hour for this exam. There are no restrictions or special instructions. THURSDAY, JULY 6.    Follow-Up: At [facility], you and your health needs are our priority.  As part of our continuing mission to provide you with exceptional heart care, we have created designated Provider Care Teams.  These Care Teams include your primary Cardiologist (physician) and Advanced Practice Providers (APPs -  Physician Assistants and Nurse Practitioners) who all work together to provide you with the care you need, when you need it.  We recommend signing up for the patient portal called "MyChart".  Sign up information is provided on this After Visit Summary.  MyChart is used to connect with patients for Virtual Visits (Telemedicine).  Patients are able to view lab/test results, encounter notes, upcoming appointments, etc.  Non-urgent messages can be sent to  your provider as well.   To learn more about what you can do with MyChart, go to [email or web address].     Your next appointment:   5 month(s)  The format for your next appointment:   In Person  Provider:   Mertie Moores, MD       Other Instructions  Monitor your Heart Rate and Blood Pressure.  If your Heart Rate consistently X 3 days in the 50's please call the office.    Important Information About Sugar         Signed, Emmaline Life, NP  05/13/2022 12:31 PM    [facility]

## 2022-05-13 ENCOUNTER — Other Ambulatory Visit: Payer: Self-pay | Admitting: Nurse Practitioner

## 2022-05-13 ENCOUNTER — Encounter: Payer: Self-pay | Admitting: Nurse Practitioner

## 2022-05-13 ENCOUNTER — Ambulatory Visit: Payer: Medicare PPO | Admitting: Nurse Practitioner

## 2022-05-13 ENCOUNTER — Encounter: Payer: Self-pay | Admitting: *Deleted

## 2022-05-13 VITALS — BP 160/60 | HR 63 | Ht 68.0 in | Wt 178.2 lb

## 2022-05-13 DIAGNOSIS — R0989 Other specified symptoms and signs involving the circulatory and respiratory systems: Secondary | ICD-10-CM

## 2022-05-13 DIAGNOSIS — I10 Essential (primary) hypertension: Secondary | ICD-10-CM | POA: Diagnosis not present

## 2022-05-13 DIAGNOSIS — R9431 Abnormal electrocardiogram [ECG] [EKG]: Secondary | ICD-10-CM

## 2022-05-13 DIAGNOSIS — R079 Chest pain, unspecified: Secondary | ICD-10-CM | POA: Diagnosis not present

## 2022-05-13 DIAGNOSIS — E782 Mixed hyperlipidemia: Secondary | ICD-10-CM | POA: Diagnosis not present

## 2022-05-13 MED ORDER — CARVEDILOL 3.125 MG PO TABS: 3.1250 mg | ORAL_TABLET | Freq: Two times a day (BID) | ORAL | 3 refills | Status: DC

## 2022-05-13 NOTE — Patient Instructions (Signed)
Medication Instructions:   START Coreg one (1) tablet by mouth ( 3.125 mg) twice daily.    *If you need a refill on your cardiac medications before your next appointment, please call your pharmacy*   Lab Work:  None ordered.  If you have labs (blood work) drawn today and your tests are completely normal, you will receive your results only by: [place] (if you have MyChart) OR A paper copy in the mail If you have any lab test that is abnormal or we need to change your treatment, we will call you to review the results.   Testing/Procedures:  Your physician has requested that you have a carotid duplex. This test is an ultrasound of the carotid arteries in your neck. It looks at blood flow through these arteries that supply the brain with blood. Allow one hour for this exam. There are no restrictions or special instructions. THURSDAY, JULY 6.    Follow-Up: At [facility], you and your health needs are our priority.  As part of our continuing mission to provide you with exceptional heart care, we have created designated Provider Care Teams.  These Care Teams include your primary Cardiologist (physician) and Advanced Practice Providers (APPs -  Physician Assistants and Nurse Practitioners) who all work together to provide you with the care you need, when you need it.  We recommend signing up for the patient portal called "MyChart".  Sign up information is provided on this After Visit Summary.  MyChart is used to connect with patients for Virtual Visits (Telemedicine).  Patients are able to view lab/test results, encounter notes, upcoming appointments, etc.  Non-urgent messages can be sent to your provider as well.   To learn more about what you can do with MyChart, go to [email or web address].    Your next appointment:   5 month(s)  The format for your next appointment:   In Person  Provider:   Mertie Moores, MD       Other Instructions  Monitor your Heart Rate and  Blood Pressure.  If your Heart Rate consistently X 3 days in the 50's please call the office.    Important Information About Sugar

## 2022-05-14 ENCOUNTER — Telehealth: Payer: Self-pay | Admitting: *Deleted

## 2022-05-14 ENCOUNTER — Ambulatory Visit (HOSPITAL_COMMUNITY)
Admission: RE | Admit: 2022-05-14 | Discharge: 2022-05-14 | Disposition: A | Discharge status: Home / Self Care | Payer: Medicare PPO | Source: Ambulatory Visit | Attending: Cardiology | Admitting: Cardiology

## 2022-05-14 DIAGNOSIS — R0989 Other specified symptoms and signs involving the circulatory and respiratory systems: Secondary | ICD-10-CM | POA: Diagnosis not present

## 2022-05-14 NOTE — Telephone Encounter (Signed)
Patient is here today to have a carotid  doppler. Per Marylou Mccoy NP. RN went over instruction for  [place] . patient received written instructions of  testing.    Patient voiced understanding. Patient was escorted to the lobby review and doppler testing.

## 2022-05-19 ENCOUNTER — Ambulatory Visit (HOSPITAL_COMMUNITY): Payer: Medicare PPO | Attending: Cardiology

## 2022-05-19 DIAGNOSIS — R9431 Abnormal electrocardiogram [ECG] [EKG]: Secondary | ICD-10-CM | POA: Diagnosis not present

## 2022-05-19 LAB — MYOCARDIAL PERFUSION IMAGING
Base ST Depression (mm): 0 mm
LV dias vol: 158 mL (ref 62–150)
LV sys vol: 66 mL
Nuc Stress EF: 58 %
Peak HR: 94 {beats}/min
Rest HR: 66 {beats}/min
Rest Nuclear Isotope Dose: 10.2 mCi
SDS: 2
SRS: 0
SSS: 2
ST Depression (mm): 0 mm
Stress Nuclear Isotope Dose: 31.7 mCi
TID: 1

## 2022-05-19 MED ORDER — REGADENOSON 0.4 MG/5ML IV SOLN
0.4000 mg | Freq: Once | INTRAVENOUS | Status: AC
  Administered 2022-05-19: 0.4 mg via INTRAVENOUS

## 2022-05-19 MED ORDER — TECHNETIUM TC 99M TETROFOSMIN IV KIT
10.2000 | PACK | Freq: Once | INTRAVENOUS | Status: AC | PRN
  Administered 2022-05-19: 10.2 via INTRAVENOUS

## 2022-05-19 MED ORDER — TECHNETIUM TC 99M TETROFOSMIN IV KIT
31.7000 | PACK | Freq: Once | INTRAVENOUS | Status: AC | PRN
  Administered 2022-05-19: 31.7 via INTRAVENOUS

## 2022-06-08 ENCOUNTER — Other Ambulatory Visit: Payer: Self-pay | Admitting: Nurse Practitioner

## 2022-06-08 DIAGNOSIS — I129 Hypertensive chronic kidney disease with stage 1 through stage 4 chronic kidney disease, or unspecified chronic kidney disease: Secondary | ICD-10-CM

## 2022-06-22 DIAGNOSIS — N1832 Chronic kidney disease, stage 3b: Secondary | ICD-10-CM | POA: Diagnosis not present

## 2022-06-30 DIAGNOSIS — D631 Anemia in chronic kidney disease: Secondary | ICD-10-CM | POA: Diagnosis not present

## 2022-06-30 DIAGNOSIS — E1122 Type 2 diabetes mellitus with diabetic chronic kidney disease: Secondary | ICD-10-CM | POA: Diagnosis not present

## 2022-06-30 DIAGNOSIS — I129 Hypertensive chronic kidney disease with stage 1 through stage 4 chronic kidney disease, or unspecified chronic kidney disease: Secondary | ICD-10-CM | POA: Diagnosis not present

## 2022-06-30 DIAGNOSIS — Z87442 Personal history of urinary calculi: Secondary | ICD-10-CM | POA: Diagnosis not present

## 2022-06-30 DIAGNOSIS — E875 Hyperkalemia: Secondary | ICD-10-CM | POA: Diagnosis not present

## 2022-06-30 DIAGNOSIS — R7303 Prediabetes: Secondary | ICD-10-CM | POA: Diagnosis not present

## 2022-06-30 DIAGNOSIS — N400 Enlarged prostate without lower urinary tract symptoms: Secondary | ICD-10-CM | POA: Diagnosis not present

## 2022-06-30 DIAGNOSIS — N184 Chronic kidney disease, stage 4 (severe): Secondary | ICD-10-CM | POA: Diagnosis not present

## 2022-06-30 DIAGNOSIS — R809 Proteinuria, unspecified: Secondary | ICD-10-CM | POA: Diagnosis not present

## 2022-07-01 ENCOUNTER — Ambulatory Visit (INDEPENDENT_AMBULATORY_CARE_PROVIDER_SITE_OTHER): Payer: Medicare PPO | Admitting: Nurse Practitioner

## 2022-07-01 VITALS — BP 138/64 | HR 66 | Temp 98.3°F | Ht 68.0 in | Wt 175.0 lb

## 2022-07-01 DIAGNOSIS — R7309 Other abnormal glucose: Secondary | ICD-10-CM

## 2022-07-01 DIAGNOSIS — E559 Vitamin D deficiency, unspecified: Secondary | ICD-10-CM

## 2022-07-01 DIAGNOSIS — E782 Mixed hyperlipidemia: Secondary | ICD-10-CM | POA: Diagnosis not present

## 2022-07-01 DIAGNOSIS — Z79899 Other long term (current) drug therapy: Secondary | ICD-10-CM

## 2022-07-01 DIAGNOSIS — Z0000 Encounter for general adult medical examination without abnormal findings: Secondary | ICD-10-CM

## 2022-07-01 DIAGNOSIS — I129 Hypertensive chronic kidney disease with stage 1 through stage 4 chronic kidney disease, or unspecified chronic kidney disease: Secondary | ICD-10-CM | POA: Diagnosis not present

## 2022-07-01 DIAGNOSIS — N183 Chronic kidney disease, stage 3 unspecified: Secondary | ICD-10-CM

## 2022-07-01 LAB — POCT URINALYSIS DIPSTICK
Bilirubin, UA: NEGATIVE
Blood, UA: NEGATIVE
Glucose, UA: NEGATIVE
Ketones, UA: NEGATIVE
Leukocytes, UA: NEGATIVE
Nitrite, UA: NEGATIVE
Protein, UA: NEGATIVE
Spec Grav, UA: 1.01 (ref 1.010–1.025)
Urobilinogen, UA: 0.2 E.U./dL
pH, UA: 5.5 (ref 5.0–8.0)

## 2022-07-01 NOTE — Patient Instructions (Signed)
Health Maintenance, Male Adopting a healthy lifestyle and getting preventive care are important in promoting health and wellness. Ask your health care provider about: The right schedule for you to have regular tests and exams. Things you can do on your own to prevent diseases and keep yourself healthy. What should I know about diet, weight, and exercise? Eat a healthy diet  Eat a diet that includes plenty of vegetables, fruits, low-fat dairy products, and lean protein. Do not eat a lot of foods that are high in solid fats, added sugars, or sodium. Maintain a healthy weight Body mass index (BMI) is a measurement that can be used to identify possible weight problems. It estimates body fat based on height and weight. Your health care provider can help determine your BMI and help you achieve or maintain a healthy weight. Get regular exercise Get regular exercise. This is one of the most important things you can do for your health. Most adults should: Exercise for at least 150 minutes each week. The exercise should increase your heart rate and make you sweat (moderate-intensity exercise). Do strengthening exercises at least twice a week. This is in addition to the moderate-intensity exercise. Spend less time sitting. Even light physical activity can be beneficial. Watch cholesterol and blood lipids Have your blood tested for lipids and cholesterol at 80 years of age, then have this test every 5 years. You may need to have your cholesterol levels checked more often if: Your lipid or cholesterol levels are high. You are older than 80 years of age. You are at high risk for heart disease. What should I know about cancer screening? Many types of cancers can be detected early and may often be prevented. Depending on your health history and family history, you may need to have cancer screening at various ages. This may include screening for: Colorectal cancer. Prostate cancer. Skin cancer. Lung  cancer. What should I know about heart disease, diabetes, and high blood pressure? Blood pressure and heart disease High blood pressure causes heart disease and increases the risk of stroke. This is more likely to develop in people who have high blood pressure readings or are overweight. Talk with your health care provider about your target blood pressure readings. Have your blood pressure checked: Every 3-5 years if you are 18-39 years of age. Every year if you are 40 years old or older. If you are between the ages of 65 and 75 and are a current or former smoker, ask your health care provider if you should have a one-time screening for abdominal aortic aneurysm (AAA). Diabetes Have regular diabetes screenings. This checks your fasting blood sugar level. Have the screening done: Once every three years after age 45 if you are at a normal weight and have a low risk for diabetes. More often and at a younger age if you are overweight or have a high risk for diabetes. What should I know about preventing infection? Hepatitis B If you have a higher risk for hepatitis B, you should be screened for this virus. Talk with your health care provider to find out if you are at risk for hepatitis B infection. Hepatitis C Blood testing is recommended for: Everyone born from 1945 through 1965. Anyone with known risk factors for hepatitis C. Sexually transmitted infections (STIs) You should be screened each year for STIs, including gonorrhea and chlamydia, if: You are sexually active and are younger than 80 years of age. You are older than 80 years of age and your   health care provider tells you that you are at risk for this type of infection. Your sexual activity has changed since you were last screened, and you are at increased risk for chlamydia or gonorrhea. Ask your health care provider if you are at risk. Ask your health care provider about whether you are at high risk for HIV. Your health care provider  may recommend a prescription medicine to help prevent HIV infection. If you choose to take medicine to prevent HIV, you should first get tested for HIV. You should then be tested every 3 months for as long as you are taking the medicine. Follow these instructions at home: Alcohol use Do not drink alcohol if your health care provider tells you not to drink. If you drink alcohol: Limit how much you have to 0-2 drinks a day. Know how much alcohol is in your drink. In the [place] one drink equals one 12 oz bottle of beer (355 mL), one 5 oz glass of wine (148 mL), or one 1 oz glass of hard liquor (44 mL). Lifestyle Do not use any products that contain nicotine or tobacco. These products include cigarettes, chewing tobacco, and vaping devices, such as e-cigarettes. If you need help quitting, ask your health care provider. Do not use street drugs. Do not share needles. Ask your health care provider for help if you need support or information about quitting drugs. General instructions Schedule regular health, dental, and eye exams. Stay current with your vaccines. Tell your health care provider if: You often feel depressed. You have ever been abused or do not feel safe at home. Summary Adopting a healthy lifestyle and getting preventive care are important in promoting health and wellness. Follow your health care provider's instructions about healthy diet, exercising, and getting tested or screened for diseases. Follow your health care provider's instructions on monitoring your cholesterol and blood pressure. This information is not intended to replace advice given to you by your health care provider. Make sure you discuss any questions you have with your health care provider. Document Revised: 03/17/2021 Document Reviewed: 03/17/2021 Elsevier Patient Education  2023 [employer]  

## 2022-07-01 NOTE — Progress Notes (Signed)
I,Tianna Badgett,acting as a Education administrator for [employer], FNP.,have documented all relevant documentation on the behalf of [name], FNP,as directed by  [name], FNP while in the presence of [name], [place].  Subjective:     Patient ID: Eddie Hutchinson , male    DOB: 08-02-1941 , 81 y.o.   MRN: [id number]   Chief Complaint  Patient presents with   Annual Exam    HPI  Here for HM.  Continues to be followed by Dr. Harrie Jeans - Nephrologist. He has seen Cardiology since his hospitalization. Overall he has been doing well.   Wt Readings from Last 3 Encounters: 07/01/22 : 175 lb (79.4 kg) 05/19/22 : 178 lb (80.7 kg) 05/13/22 : 178 lb 3.2 oz (80.8 kg)    Hypertension This is a chronic problem. The current episode started more than 1 year ago. The problem has been gradually worsening since onset. The problem is uncontrolled. Pertinent negatives include no anxiety. There are no associated agents to hypertension. Risk factors for coronary artery disease include sedentary lifestyle. Past treatments include calcium channel blockers. Compliance problems include exercise.  There is no history of angina, kidney disease or CAD/MI. There is no history of chronic renal disease.     Past Medical History:  Diagnosis Date   Abnormal EKG 11/24/2016   LAFB with LVH and QRS widening   Benign essential HTN 11/24/2016   Family history of early CAD 11/24/2016   Glucose intolerance (impaired glucose tolerance)    Hyperlipidemia 11/24/2016   Hypertension    Kidney stone    PVC's (premature ventricular contractions) 11/24/2016   Vitamin D deficiency      Family History  Problem Relation Age of Onset   Heart failure Mother    Heart attack Father 40   Heart disease Father    CVA Brother    Cancer Brother      Current Outpatient Medications:    amLODipine (NORVASC) 10 MG tablet, TAKE 1 TABLET BY MOUTH EVERY DAY, Disp: 90 tablet, Rfl: 1   carvedilol (COREG) 3.125 MG tablet, Take 1 tablet  (3.125 mg total) by mouth 2 (two) times daily., Disp: 180 tablet, Rfl: 3   colchicine 0.6 MG tablet, Take 1 tablet (0.6 mg total) by mouth daily for 10 days. (Patient not taking: Reported on 05/13/2022), Disp: 10 tablet, Rfl: 0   Ergocalciferol (VITAMIN D2 PO), Take 1,000 Units by mouth daily. , Disp: , Rfl:    fenofibrate 160 MG tablet, Take 1 tablet (160 mg total) by mouth daily., Disp: 90 tablet, Rfl: 1   ferrous sulfate 325 (65 FE) MG tablet, TAKE 1 TABLET BY MOUTH EVERY DAY (Patient taking differently: Take 325 mg by mouth daily.), Disp: 90 tablet, Rfl: 1   furosemide (LASIX) 40 MG tablet, Take 40 mg by mouth daily., Disp: , Rfl:    hydrALAZINE (APRESOLINE) 100 MG tablet, Take 100 mg by mouth 3 (three) times daily., Disp: , Rfl:    isosorbide mononitrate (IMDUR) 30 MG 24 hr tablet, Take 1 tablet by mouth daily., Disp: , Rfl:    losartan (COZAAR) 25 MG tablet, Take 25 mg by mouth daily., Disp: , Rfl:    pantoprazole (PROTONIX) 40 MG tablet, Take 1 tablet (40 mg total) by mouth daily., Disp: 30 tablet, Rfl: 0   triamcinolone ointment (KENALOG) 0.5 %, Apply 1 application. topically 2 (two) times daily., Disp: 30 g, Rfl: 2   Allergies  Allergen Reactions   Bee Venom Hives    As  a child      Men's preventive visit. Patient Health Questionnaire (PHQ-2) is  [place] Office Visit from 07/01/2022 in [facility]  PHQ-2 Total Score 0     Patient is on a Regular diet; he avoids eating fast food. Exercising - with bowling (just started). He does mow his and his neighbors yard with a Chiropractor. Marital status: Widowed. Relevant history for alcohol use is:  Social History   Substance and Sexual Activity  Alcohol Use Yes   Comment: occassionally   Relevant history for tobacco use is:  Social History   Tobacco Use  Smoking Status Former  Smokeless Tobacco Never  .   Review of Systems  Constitutional: Negative.   HENT: Negative.    Eyes: Negative.    Respiratory: Negative.    Cardiovascular: Negative.   Gastrointestinal: Negative.   Endocrine: Negative.   Genitourinary: Negative.   Musculoskeletal: Negative.   Skin: Negative.   Allergic/Immunologic: Negative.   Neurological: Negative.   Hematological: Negative.   Psychiatric/Behavioral: Negative.       Today's Vitals   07/01/22 0943  BP: 138/64  Pulse: 66  Temp: 98.3 F (36.8 C)  TempSrc: Oral  Weight: 175 lb (79.4 kg)  Height: $Remove'5\' 8"'XBAyySB$  (1.727 m)   Body mass index is 26.61 kg/m.  Wt Readings from Last 3 Encounters:  07/01/22 175 lb (79.4 kg)  05/19/22 178 lb (80.7 kg)  05/13/22 178 lb 3.2 oz (80.8 kg)    Objective:  Physical Exam Vitals reviewed.  Constitutional:      General: He is not in acute distress.    Appearance: Normal appearance.  HENT:     Head: Normocephalic and atraumatic.     Right Ear: Tympanic membrane, ear canal and external ear normal. There is no impacted cerumen.     Left Ear: Tympanic membrane, ear canal and external ear normal. There is no impacted cerumen.     Nose:     Comments: Deferred - masked    Mouth/Throat:     Comments: Deferred - masked Eyes:     Pupils: Pupils are equal, round, and reactive to light.  Cardiovascular:     Rate and Rhythm: Normal rate and regular rhythm.     Pulses: Normal pulses.     Heart sounds: Normal heart sounds. No murmur heard. Pulmonary:     Effort: Pulmonary effort is normal. No respiratory distress.     Breath sounds: Normal breath sounds. No wheezing.  Abdominal:     General: Abdomen is flat. Bowel sounds are normal. There is no distension.     Palpations: Abdomen is soft. There is no mass.     Tenderness: There is no abdominal tenderness.  Genitourinary:    Comments: N/A Musculoskeletal:        General: No tenderness. Normal range of motion.     Cervical back: Normal range of motion and neck supple.     Right lower leg: No edema.     Left lower leg: No edema.  Skin:    General: Skin is  warm and dry.     Capillary Refill: Capillary refill takes less than 2 seconds.  Neurological:     General: No focal deficit present.     Mental Status: He is alert and oriented to person, place, and time.     Cranial Nerves: No cranial nerve deficit.     Motor: No weakness.  Psychiatric:        Mood and Affect:  Mood normal.        Behavior: Behavior normal.        Thought Content: Thought content normal.        Judgment: Judgment normal.         Assessment And Plan:    1. Health maintenance examination Behavior modifications discussed and diet history reviewed.   Pt will continue to exercise regularly and modify diet with low GI, plant based foods and decrease intake of processed foods.  Recommend intake of daily multivitamin, Vitamin D, and calcium.  Recommend mammogram and colonoscopy for preventive screenings, as well as recommend immunizations that include influenza, TDAP, and Shingles (will check with pharmacy for his second dose)  2. Benign hypertension with CKD (chronic kidney disease) stage III (HCC) Comments: Blood pressure is controlled, continue current medications.  - POCT Urinalysis Dipstick (81002) - Microalbumin / Creatinine Urine Ratio  3. Mixed hyperlipidemia Comments: Stable, continue statin.  - Lipid panel  4. Abnormal glucose Comments: Continue focusing on healthy diet and regular exercise.   5. Vitamin D deficiency Will check vitamin D level and supplement as needed.    Also encouraged to spend 15 minutes in the sun daily.  - VITAMIN D 25 Hydroxy (Vit-D Deficiency, Fractures)  6. Other long term (current) drug therapy - CBC   Patient was given opportunity to ask questions. Patient verbalized understanding of the plan and was able to repeat key elements of the plan. All questions were answered to their satisfaction.   [name], FNP   I, [name], FNP, have reviewed all documentation for this visit. The documentation on 07/01/22 for the  exam, diagnosis, procedures, and orders are all accurate and complete.   THE PATIENT IS ENCOURAGED TO PRACTICE SOCIAL DISTANCING DUE TO THE COVID-19 PANDEMIC.

## 2022-07-02 LAB — CBC
Hematocrit: 32.4 % — ABNORMAL LOW (ref 37.5–51.0)
Hemoglobin: 10.3 g/dL — ABNORMAL LOW (ref 13.0–17.7)
MCH: 27.8 pg (ref 26.6–33.0)
MCHC: 31.8 g/dL (ref 31.5–35.7)
MCV: 87 fL (ref 79–97)
Platelets: 525 10*3/uL — ABNORMAL HIGH (ref 150–450)
RBC: 3.71 x10E6/uL — ABNORMAL LOW (ref 4.14–5.80)
RDW: 15.5 % — ABNORMAL HIGH (ref 11.6–15.4)
WBC: 7.6 10*3/uL (ref 3.4–10.8)

## 2022-07-02 LAB — MICROALBUMIN / CREATININE URINE RATIO
Creatinine, Urine: 20.3 mg/dL
Microalb/Creat Ratio: 50 mg/g creat — ABNORMAL HIGH (ref 0–29)
Microalbumin, Urine: 10.1 ug/mL

## 2022-07-02 LAB — LIPID PANEL
Chol/HDL Ratio: 2.7 ratio (ref 0.0–5.0)
Cholesterol, Total: 125 mg/dL (ref 100–199)
HDL: 47 mg/dL (ref >39)
LDL Chol Calc (NIH): 65 mg/dL (ref 0–99)
Triglycerides: 59 mg/dL (ref 0–149)
VLDL Cholesterol Cal: 13 mg/dL (ref 5–40)

## 2022-07-02 LAB — VITAMIN D 25 HYDROXY (VIT D DEFICIENCY, FRACTURES): Vit D, 25-Hydroxy: 30.1 ng/mL (ref 30.0–100.0)

## 2022-07-08 ENCOUNTER — Encounter: Payer: Self-pay | Admitting: Nurse Practitioner

## 2022-07-30 ENCOUNTER — Ambulatory Visit (INDEPENDENT_AMBULATORY_CARE_PROVIDER_SITE_OTHER): Payer: Medicare PPO

## 2022-07-30 VITALS — BP 138/80 | HR 70 | Temp 98.2°F

## 2022-07-30 DIAGNOSIS — Z23 Encounter for immunization: Secondary | ICD-10-CM | POA: Diagnosis not present

## 2022-07-30 NOTE — Patient Instructions (Signed)
Influenza Virus Vaccine injection What is this medication? INFLUENZA VIRUS VACCINE (in floo EN zuh VAHY ruhs vak SEEN) helps to reduce the risk of getting influenza also known as the flu. The vaccine only helps protect you against some strains of the flu. This medicine may be used for other purposes; ask your health care provider or pharmacist if you have questions. COMMON BRAND NAME(S): Afluria, Afluria Quadrivalent, Agriflu, Alfuria, FLUAD, FLUAD Quadrivalent, Fluarix, Fluarix Quadrivalent, Flublok, Flublok Quadrivalent, FLUCELVAX, FLUCELVAX Quadrivalent, Flulaval, Flulaval Quadrivalent, Fluvirin, Fluzone, Fluzone High-Dose, Fluzone Intradermal, Fluzone Quadrivalent What should I tell my care team before I take this medication? They need to know if you have any of these conditions: bleeding disorder like hemophilia fever or infection Guillain-Barre syndrome or other neurological problems immune system problems infection with the human immunodeficiency virus (HIV) or AIDS low blood platelet counts multiple sclerosis an unusual or allergic reaction to influenza virus vaccine, latex, other medicines, foods, dyes, or preservatives. Different brands of vaccines contain different allergens. Some may contain latex or eggs. Talk to your doctor about your allergies to make sure that you get the right vaccine. pregnant or trying to get pregnant breast-feeding How should I use this medication? This vaccine is for injection into a muscle or under the skin. It is given by a health care professional. A copy of Vaccine Information Statements will be given before each vaccination. Read this sheet carefully each time. The sheet may change frequently. Talk to your healthcare provider to see which vaccines are right for you. Some vaccines should not be used in all age groups. Overdosage: If you think you have taken too much of this medicine contact a poison control center or emergency room at once. NOTE: This  medicine is only for you. Do not share this medicine with others. What if I miss a dose? This does not apply. What may interact with this medication? chemotherapy or radiation therapy medicines that lower your immune system like etanercept, anakinra, infliximab, and adalimumab medicines that treat or prevent blood clots like warfarin phenytoin steroid medicines like prednisone or cortisone theophylline vaccines This list may not describe all possible interactions. Give your health care provider a list of all the medicines, herbs, non-prescription drugs, or dietary supplements you use. Also tell them if you smoke, drink alcohol, or use illegal drugs. Some items may interact with your medicine. What should I watch for while using this medication? Report any side effects that do not go away within 3 days to your doctor or health care professional. Call your health care provider if any unusual symptoms occur within 6 weeks of receiving this vaccine. You may still catch the flu, but the illness is not usually as bad. You cannot get the flu from the vaccine. The vaccine will not protect against colds or other illnesses that may cause fever. The vaccine is needed every year. What side effects may I notice from receiving this medication? Side effects that you should report to your doctor or health care professional as soon as possible: allergic reactions like skin rash, itching or hives, swelling of the face, lips, or tongue Side effects that usually do not require medical attention (report to your doctor or health care professional if they continue or are bothersome): fever headache muscle aches and pains pain, tenderness, redness, or swelling at the injection site tiredness This list may not describe all possible side effects. Call your doctor for medical advice about side effects. You may report side effects to FDA at 1-800-FDA-1088.  Where should I keep my medication? The vaccine will be given  by a health care professional in a clinic, pharmacy, doctor's office, or other health care setting. You will not be given vaccine doses to store at home. NOTE: This sheet is a summary. It may not cover all possible information. If you have questions about this medicine, talk to your doctor, pharmacist, or health care provider.  2023 Elsevier/Gold Standard (2021-05-30 00:00:00)

## 2022-07-30 NOTE — Progress Notes (Signed)
Patient presents today for flu vaccine.

## 2022-08-04 ENCOUNTER — Ambulatory Visit: Payer: Medicare PPO

## 2022-08-07 ENCOUNTER — Other Ambulatory Visit: Payer: Self-pay | Admitting: Nurse Practitioner

## 2022-10-12 DIAGNOSIS — N184 Chronic kidney disease, stage 4 (severe): Secondary | ICD-10-CM | POA: Diagnosis not present

## 2022-10-20 ENCOUNTER — Encounter: Payer: Self-pay | Admitting: Cardiovascular Disease

## 2022-10-23 DIAGNOSIS — D631 Anemia in chronic kidney disease: Secondary | ICD-10-CM | POA: Diagnosis not present

## 2022-10-23 DIAGNOSIS — E1122 Type 2 diabetes mellitus with diabetic chronic kidney disease: Secondary | ICD-10-CM | POA: Diagnosis not present

## 2022-10-23 DIAGNOSIS — R7303 Prediabetes: Secondary | ICD-10-CM | POA: Diagnosis not present

## 2022-10-23 DIAGNOSIS — I129 Hypertensive chronic kidney disease with stage 1 through stage 4 chronic kidney disease, or unspecified chronic kidney disease: Secondary | ICD-10-CM | POA: Diagnosis not present

## 2022-10-23 DIAGNOSIS — R809 Proteinuria, unspecified: Secondary | ICD-10-CM | POA: Diagnosis not present

## 2022-10-23 DIAGNOSIS — Z87442 Personal history of urinary calculi: Secondary | ICD-10-CM | POA: Diagnosis not present

## 2022-10-23 DIAGNOSIS — N400 Enlarged prostate without lower urinary tract symptoms: Secondary | ICD-10-CM | POA: Diagnosis not present

## 2022-10-23 DIAGNOSIS — E875 Hyperkalemia: Secondary | ICD-10-CM | POA: Diagnosis not present

## 2022-10-23 DIAGNOSIS — N184 Chronic kidney disease, stage 4 (severe): Secondary | ICD-10-CM | POA: Diagnosis not present

## 2022-10-24 ENCOUNTER — Other Ambulatory Visit: Payer: Self-pay | Admitting: Nurse Practitioner

## 2022-10-27 ENCOUNTER — Ambulatory Visit: Payer: Medicare PPO | Admitting: Cardiovascular Disease

## 2022-10-27 NOTE — Progress Notes (Unsigned)
Cardiology Office Note:    Date:  10/29/2022   ID:  Eddie Hutchinson, DOB August 17, 1941, MRN [id number]  PCP:  [name], [facility] Cardiologist:  Stark Jock  [place] Electrophysiologist:  None   Referring MD: [name], FNP   No chief complaint on file. carotid briut  Sept. 13, 2021   Eddie Hutchinson is a 81 y.o. male with a hx of HTN, HLD and a carotid bruit.   We are asked to see him by [name], FNP for further eval and management of hisl eft  carotid bruit  He has been seen by Dr. Radford Pax I the past ( Jan. 2018)  Echo during that evaluation revealed a normal LV EF of 55-60%.  Has mild LAE Stress myoview showed no evidence of ischemia   Lipids from his primary MD were reviewed His LDL is 64.   HDL = 40,  total col = 119 Trigs = 75  No stroke or TIA symptoms.  No CP or dyspea. Fairly active ,   Does his yard work ,  Engineer, manufacturing systems occasionally  Does not walk , No arm or leg weakness. Has CKD.   Sees Dr. Royce Macadamia at [facility]  Sept. 12, 2022: Eddie Hutchinson is seen for follow up of his HTN, HLD, carotid bruit  Is avoiding salty foods   October 29, 2022: Eddie Hutchinson is seen today for follow-up of his hypertension, hyperlipidemia, carotid bruit.  He was admitted to the hospital on June 19 through June 20 for chest pain that was eventually thought to be due to pericarditis.  Treated with colchicine, very rapid steroid taper.  He was instructed to avoid NSAIDs due to his CKD.  VSS today   Feeling well,  no further pleuretic CP   Echocardiogram from April 28, 2022 reveals normal left ventricular systolic function.  He has grade 1 diastolic dysfunction. RV size and function are normal.  There is no evidence of pericardial effusion.   Past Medical History:  Diagnosis Date   Abnormal EKG 11/24/2016   LAFB with LVH and QRS widening   Benign essential HTN 11/24/2016   Family history of early CAD 11/24/2016   Glucose intolerance (impaired glucose tolerance)     Hyperlipidemia 11/24/2016   Hypertension    Kidney stone    PVC's (premature ventricular contractions) 11/24/2016   Vitamin D deficiency     Past Surgical History:  Procedure Laterality Date   CYSTOSCOPY/RETROGRADE/URETEROSCOPY/STONE EXTRACTION WITH BASKET     KIDNEY STONE SURGERY     LITHOTRIPSY     TOOTH EXTRACTION Left 01/2021    Current Medications: Current Meds  Medication Sig   amLODipine (NORVASC) 10 MG tablet TAKE 1 TABLET BY MOUTH EVERY DAY   carvedilol (COREG) 3.125 MG tablet Take 1 tablet (3.125 mg total) by mouth 2 (two) times daily.   Ergocalciferol (VITAMIN D2 PO) Take 1,000 Units by mouth daily.    fenofibrate 160 MG tablet TAKE 1 TABLET BY MOUTH EVERY DAY   ferrous sulfate 325 (65 FE) MG tablet Take 1 tablet (325 mg total) by mouth daily.   furosemide (LASIX) 40 MG tablet Take 40 mg by mouth daily.   hydrALAZINE (APRESOLINE) 100 MG tablet Take 100 mg by mouth 3 (three) times daily.   isosorbide mononitrate (IMDUR) 30 MG 24 hr tablet Take 1 tablet by mouth daily.   losartan (COZAAR) 50 MG tablet Take 50 mg by mouth daily.   triamcinolone ointment (KENALOG) 0.5 % Apply 1 application. topically  2 (two) times daily.     Allergies:   Bee venom   Social History   Socioeconomic History   Marital status: Widowed    Spouse name: Not on file   Number of children: Not on file   Years of education: Not on file   Highest education level: Not on file  Occupational History   Occupation: retired  Tobacco Use   Smoking status: Former   Smokeless tobacco: Never  Scientific laboratory technician Use: Never used  Substance and Sexual Activity   Alcohol use: Yes    Comment: occassionally   Drug use: No   Sexual activity: Not Currently  Other Topics Concern   Not on file  Social History Narrative   Not on file   Social Determinants of Health   Financial Resource Strain: Low Risk  (02/26/2022)   Overall Financial Resource Strain (CARDIA)    Difficulty of Paying Living  Expenses: Not hard at all  Food Insecurity: No Food Insecurity (02/26/2022)   Hunger Vital Sign    Worried About Running Out of Food in the Last Year: Never true    [place] in the Last Year: Never true  Transportation Needs: No Transportation Needs (02/26/2022)   PRAPARE - Hydrologist (Medical): No    Lack of Transportation (Non-Medical): No  Physical Activity: Inactive (02/26/2022)   Exercise Vital Sign    Days of Exercise per Week: 0 days    Minutes of Exercise per Session: 0 min  Stress: No Stress Concern Present (02/26/2022)   [place]    Feeling of Stress : Not at all  Social Connections: Not on file     Family History: The patient's family history includes CVA in his brother; Cancer in his brother; Heart attack (age of onset: 4) in his father; Heart disease in his father; Heart failure in his mother.  ROS:   Please see the history of present illness.     All other systems reviewed and are negative.  EKGs/Labs/Other Studies Reviewed:    The following studies were reviewed today:    Recent Labs: 04/27/2022: B Natriuretic Peptide 298.6 04/28/2022: ALT 26; BUN 20; Creatinine, Ser 2.05; Magnesium 2.0; Potassium 3.9; Sodium 138 07/01/2022: Hemoglobin 10.3; Platelets 525  Recent Lipid Panel    Component Value Date/Time   CHOL 125 07/01/2022 1015   TRIG 59 07/01/2022 1015   HDL 47 07/01/2022 1015   CHOLHDL 2.7 07/01/2022 1015   LDLCALC 65 07/01/2022 1015    Physical Exam:    Physical Exam: Blood pressure (!) 128/58, pulse (!) 56, height $RemoveBe'5\' 8"'bmhIDMGbR$  (1.727 m), weight 182 lb (82.6 kg), SpO2 98 %.       GEN:  Well nourished, well developed in no acute distress HEENT: Normal NECK: No JVD; No carotid bruits LYMPHATICS: No lymphadenopathy CARDIAC: RRR ,  soft murmur  RESPIRATORY:  Clear to auscultation without rales, wheezing or rhonchi  ABDOMEN: Soft, non-tender,  non-distended MUSCULOSKELETAL:  No edema; No deformity  SKIN: Warm and dry NEUROLOGIC:  Alert and oriented x 3   EKG:     ASSESSMENT:    1. Mixed hyperlipidemia   2. Essential hypertension      PLAN:     1.   Carotid bruit:  stable .  Lipids look god     2.  Systolic murmur :   stable    3.  Hyperlipidemia:  labs look  great    4.  HTN:  BP is normal ,  cont meds     Medication Adjustments/Labs and Tests Ordered: Current medicines are reviewed at length with the patient today.  Concerns regarding medicines are outlined above.  No orders of the defined types were placed in this encounter.   No orders of the defined types were placed in this encounter.     Patient Instructions  Medication Instructions:  Your physician recommends that you continue on your current medications as directed. Please refer to the Current Medication list given to you today.  *If you need a refill on your cardiac medications before your next appointment, please call your pharmacy*   Lab Work: NONE If you have labs (blood work) drawn today and your tests are completely normal, you will receive your results only by: [place] (if you have MyChart) OR A paper copy in the mail If you have any lab test that is abnormal or we need to change your treatment, we will call you to review the results.   Testing/Procedures: NONE   Follow-Up: At [facility], you and your health needs are our priority.  As part of our continuing mission to provide you with exceptional heart care, we have created designated Provider Care Teams.  These Care Teams include your primary Cardiologist (physician) and Advanced Practice Providers (APPs -  Physician Assistants and Nurse Practitioners) who all work together to provide you with the care you need, when you need it.  We recommend signing up for the patient portal called "MyChart".  Sign up information is provided on this After Visit Summary.   MyChart is used to connect with patients for Virtual Visits (Telemedicine).  Patients are able to view lab/test results, encounter notes, upcoming appointments, etc.  Non-urgent messages can be sent to your provider as well.   To learn more about what you can do with MyChart, go to [email or web address].    Your next appointment:   1 year(s)  The format for your next appointment:   In Person  Provider:   Mertie Moores, MD       Important Information About Sugar         Signed, Mertie Moores, MD  10/29/2022 8:52 AM    [place]

## 2022-10-29 ENCOUNTER — Encounter: Payer: Self-pay | Admitting: Cardiovascular Disease

## 2022-10-29 ENCOUNTER — Ambulatory Visit: Payer: Medicare PPO | Attending: Cardiovascular Disease | Admitting: Cardiovascular Disease

## 2022-10-29 VITALS — BP 128/58 | HR 56 | Ht 68.0 in | Wt 182.0 lb

## 2022-10-29 DIAGNOSIS — I10 Essential (primary) hypertension: Secondary | ICD-10-CM | POA: Diagnosis not present

## 2022-10-29 DIAGNOSIS — E782 Mixed hyperlipidemia: Secondary | ICD-10-CM | POA: Diagnosis not present

## 2022-10-29 NOTE — Patient Instructions (Signed)
Medication Instructions:  Your physician recommends that you continue on your current medications as directed. Please refer to the Current Medication list given to you today.  *If you need a refill on your cardiac medications before your next appointment, please call your pharmacy*   Lab Work: NONE If you have labs (blood work) drawn today and your tests are completely normal, you will receive your results only by: MyChart Message (if you have MyChart) OR A paper copy in the mail If you have any lab test that is abnormal or we need to change your treatment, we will call you to review the results.   Testing/Procedures: NONE   Follow-Up: At [facility], you and your health needs are our priority.  As part of our continuing mission to provide you with exceptional heart care, we have created designated Provider Care Teams.  These Care Teams include your primary Cardiologist (physician) and Advanced Practice Providers (APPs -  Physician Assistants and Nurse Practitioners) who all work together to provide you with the care you need, when you need it.  We recommend signing up for the patient portal called "MyChart".  Sign up information is provided on this After Visit Summary.  MyChart is used to connect with patients for Virtual Visits (Telemedicine).  Patients are able to view lab/test results, encounter notes, upcoming appointments, etc.  Non-urgent messages can be sent to your provider as well.   To learn more about what you can do with MyChart, go to [email or web address].    Your next appointment:   1 year(s)  The format for your next appointment:   In Person  Provider:   Philip Nahser, MD       Important Information About Sugar       

## 2022-11-05 ENCOUNTER — Telehealth (INDEPENDENT_AMBULATORY_CARE_PROVIDER_SITE_OTHER): Payer: Medicare PPO | Admitting: Nurse Practitioner

## 2022-11-05 ENCOUNTER — Encounter: Payer: Self-pay | Admitting: Nurse Practitioner

## 2022-11-05 DIAGNOSIS — N183 Chronic kidney disease, stage 3 unspecified: Secondary | ICD-10-CM

## 2022-11-05 DIAGNOSIS — I129 Hypertensive chronic kidney disease with stage 1 through stage 4 chronic kidney disease, or unspecified chronic kidney disease: Secondary | ICD-10-CM

## 2022-11-05 DIAGNOSIS — E782 Mixed hyperlipidemia: Secondary | ICD-10-CM

## 2022-11-05 DIAGNOSIS — E559 Vitamin D deficiency, unspecified: Secondary | ICD-10-CM

## 2022-11-05 DIAGNOSIS — R7309 Other abnormal glucose: Secondary | ICD-10-CM

## 2022-11-05 MED ORDER — SHINGRIX 50 MCG/0.5ML IM SUSR: 0.5000 mL | Freq: Once | INTRAMUSCULAR | 0 refills | Status: AC

## 2022-11-05 NOTE — Patient Instructions (Signed)
Hypertension, Adult ?Hypertension is another name for high blood pressure. High blood pressure forces your heart to work harder to pump blood. This can cause problems over time. ?There are two numbers in a blood pressure reading. There is a top number (systolic) over a bottom number (diastolic). It is best to have a blood pressure that is below 120/80. ?What are the causes? ?The cause of this condition is not known. Some other conditions can lead to high blood pressure. ?What increases the risk? ?Some lifestyle factors can make you more likely to develop high blood pressure: ?Smoking. ?Not getting enough exercise or physical activity. ?Being overweight. ?Having too much fat, sugar, calories, or salt (sodium) in your diet. ?Drinking too much alcohol. ?Other risk factors include: ?Having any of these conditions: ?Heart disease. ?Diabetes. ?High cholesterol. ?Kidney disease. ?Obstructive sleep apnea. ?Having a family history of high blood pressure and high cholesterol. ?Age. The risk increases with age. ?Stress. ?What are the signs or symptoms? ?High blood pressure may not cause symptoms. Very high blood pressure (hypertensive crisis) may cause: ?Headache. ?Fast or uneven heartbeats (palpitations). ?Shortness of breath. ?Nosebleed. ?Vomiting or feeling like you may vomit (nauseous). ?Changes in how you see. ?Very bad chest pain. ?Feeling dizzy. ?Seizures. ?How is this treated? ?This condition is treated by making healthy lifestyle changes, such as: ?Eating healthy foods. ?Exercising more. ?Drinking less alcohol. ?Your doctor may prescribe medicine if lifestyle changes do not help enough and if: ?Your top number is above 130. ?Your bottom number is above 80. ?Your personal target blood pressure may vary. ?Follow these instructions at home: ?Eating and drinking ? ?If told, follow the DASH eating plan. To follow this plan: ?Fill one half of your plate at each meal with fruits and vegetables. ?Fill one fourth of your plate  at each meal with whole grains. Whole grains include whole-wheat pasta, brown rice, and whole-grain bread. ?Eat or drink low-fat dairy products, such as skim milk or low-fat yogurt. ?Fill one fourth of your plate at each meal with low-fat (lean) proteins. Low-fat proteins include fish, chicken without skin, eggs, beans, and tofu. ?Avoid fatty meat, cured and processed meat, or chicken with skin. ?Avoid pre-made or processed food. ?Limit the amount of salt in your diet to less than 1,500 mg each day. ?Do not drink alcohol if: ?Your doctor tells you not to drink. ?You are pregnant, may be pregnant, or are planning to become pregnant. ?If you drink alcohol: ?Limit how much you have to: ?0-1 drink a day for women. ?0-2 drinks a day for men. ?Know how much alcohol is in your drink. In the [place] one drink equals one 12 oz bottle of beer (355 mL), one 5 oz glass of wine (148 mL), or one 1? oz glass of hard liquor (44 mL). ?Lifestyle ? ?Work with your doctor to stay at a healthy weight or to lose weight. Ask your doctor what the best weight is for you. ?Get at least 30 minutes of exercise that causes your heart to beat faster (aerobic exercise) most days of the week. This may include walking, swimming, or biking. ?Get at least 30 minutes of exercise that strengthens your muscles (resistance exercise) at least 3 days a week. This may include lifting weights or doing Pilates. ?Do not smoke or use any products that contain nicotine or tobacco. If you need help quitting, ask your doctor. ?Check your blood pressure at home as told by your doctor. ?Keep all follow-up visits. ?Medicines ?Take over-the-counter and prescription medicines   only as told by your doctor. Follow directions carefully. ?Do not skip doses of blood pressure medicine. The medicine does not work as well if you skip doses. Skipping doses also puts you at risk for problems. ?Ask your doctor about side effects or reactions to medicines that you should watch  for. ?Contact a doctor if: ?You think you are having a reaction to the medicine you are taking. ?You have headaches that keep coming back. ?You feel dizzy. ?You have swelling in your ankles. ?You have trouble with your vision. ?Get help right away if: ?You get a very bad headache. ?You start to feel mixed up (confused). ?You feel weak or numb. ?You feel faint. ?You have very bad pain in your: ?Chest. ?Belly (abdomen). ?You vomit more than once. ?You have trouble breathing. ?These symptoms may be an emergency. Get help right away. Call 911. ?Do not wait to see if the symptoms will go away. ?Do not drive yourself to the hospital. ?Summary ?Hypertension is another name for high blood pressure. ?High blood pressure forces your heart to work harder to pump blood. ?For most people, a normal blood pressure is less than 120/80. ?Making healthy choices can help lower blood pressure. If your blood pressure does not get lower with healthy choices, you may need to take medicine. ?This information is not intended to replace advice given to you by your health care provider. Make sure you discuss any questions you have with your health care provider. ?Document Revised: 08/14/2021 Document Reviewed: 08/14/2021 ?Elsevier Patient Education ? 2023 Elsevier Inc. ? ?

## 2022-11-05 NOTE — Progress Notes (Signed)
Virtual Visit via Telephone   This visit type was conducted due to national recommendations for restrictions regarding the COVID-19 Pandemic (e.g. social distancing) in an effort to limit this patient's exposure and mitigate transmission in our community.  Due to his co-morbid illnesses, this patient is at least at moderate risk for complications without adequate follow up.  This format is felt to be most appropriate for this patient at this time.  All issues noted in this document were discussed and addressed.  A limited physical exam was performed with this format.    This visit type was conducted due to national recommendations for restrictions regarding the COVID-19 Pandemic (e.g. social distancing) in an effort to limit this patient's exposure and mitigate transmission in our community.  Patients identity confirmed using two different identifiers.  This format is felt to be most appropriate for this patient at this time.  All issues noted in this document were discussed and addressed.  No physical exam was performed (except for noted visual exam findings with Video Visits).    Date:  11/05/2022   ID:  Eddie Hutchinson, DOB 25-May-1941, MRN [id number]  Patient Location:  Home - spoke with Gretchen Portela via telephone  Provider location:   Office    Chief Complaint:  f/u blood pressure  History of Present Illness:    Eddie Hutchinson is a 81 y.o. male who presents via video conferencing for a telehealth visit today.    The patient does not have symptoms concerning for COVID-19 infection (fever, chills, cough, or new shortness of breath).   Presents today for his HTN follow up. He has a blood pressure machine but unsure of how accurate the machine is has to take twice. Reports compliance with meds. Dr. Royce Macadamia changed his irbsartan to 50 mg from 25 mg. He seen her December 15th with lab work. He went to Dr. Cathie Olden and every thing went well, no changes.   He has no specific questions or  concerns.  Denies headache, chest pain, SOB.   Hypertension This is a chronic problem. The current episode started more than 1 year ago. The problem has been gradually improving since onset. The problem is controlled. Pertinent negatives include no blurred vision, chest pain, headaches, palpitations, peripheral edema or shortness of breath. There are no associated agents to hypertension. Risk factors for coronary artery disease include male gender and family history. Past treatments include alpha 1 blockers and calcium channel blockers. The current treatment provides mild improvement. There are no compliance problems.  Hypertensive end-organ damage includes CAD/MI. There is no history of CVA or left ventricular hypertrophy. Identifiable causes of hypertension include chronic renal disease (stage 2-3 kidney ).     Past Medical History:  Diagnosis Date   Abnormal EKG 11/24/2016   LAFB with LVH and QRS widening   Benign essential HTN 11/24/2016   Family history of early CAD 11/24/2016   Glucose intolerance (impaired glucose tolerance)    Hyperlipidemia 11/24/2016   Hypertension    Kidney stone    PVC's (premature ventricular contractions) 11/24/2016   Vitamin D deficiency    Past Surgical History:  Procedure Laterality Date   CYSTOSCOPY/RETROGRADE/URETEROSCOPY/STONE EXTRACTION WITH BASKET     KIDNEY STONE SURGERY     LITHOTRIPSY     TOOTH EXTRACTION Left 01/2021     Current Meds  Medication Sig   amLODipine (NORVASC) 10 MG tablet TAKE 1 TABLET BY MOUTH EVERY DAY   carvedilol (COREG) 3.125 MG tablet Take 1 tablet (  3.125 mg total) by mouth 2 (two) times daily.   Ergocalciferol (VITAMIN D2 PO) Take 1,000 Units by mouth daily.    fenofibrate 160 MG tablet TAKE 1 TABLET BY MOUTH EVERY DAY   ferrous sulfate 325 (65 FE) MG tablet Take 1 tablet (325 mg total) by mouth daily.   furosemide (LASIX) 40 MG tablet Take 40 mg by mouth daily.   hydrALAZINE (APRESOLINE) 100 MG tablet Take 100 mg by  mouth 3 (three) times daily.   isosorbide mononitrate (IMDUR) 30 MG 24 hr tablet Take 1 tablet by mouth daily.   losartan (COZAAR) 50 MG tablet Take 50 mg by mouth daily.   triamcinolone ointment (KENALOG) 0.5 % Apply 1 application. topically 2 (two) times daily.   Zoster Vaccine Adjuvanted [facility]) injection Inject 0.5 mLs into the muscle once for 1 dose.     Allergies:   Bee venom   Social History   Tobacco Use   Smoking status: Former   Smokeless tobacco: Never  Scientific laboratory technician Use: Never used  Substance Use Topics   Alcohol use: Yes    Comment: occassionally   Drug use: No     Family Hx: The patient's family history includes CVA in his brother; Cancer in his brother; Heart attack (age of onset: 68) in his father; Heart disease in his father; Heart failure in his mother.  ROS:   Please see the history of present illness.    Review of Systems  Constitutional: Negative.   HENT: Negative.    Eyes:  Negative for blurred vision.  Respiratory: Negative.  Negative for shortness of breath.   Cardiovascular:  Negative for chest pain and palpitations.  Gastrointestinal: Negative.   Genitourinary: Negative.   Musculoskeletal: Negative.   Skin: Negative.   Neurological: Negative.  Negative for headaches.  Endo/Heme/Allergies: Negative.   Psychiatric/Behavioral: Negative.      All other systems reviewed and are negative.   Labs/Other Tests and Data Reviewed:    Recent Labs: 04/27/2022: B Natriuretic Peptide 298.6 04/28/2022: ALT 26; BUN 20; Creatinine, Ser 2.05; Magnesium 2.0; Potassium 3.9; Sodium 138 07/01/2022: Hemoglobin 10.3; Platelets 525   Recent Lipid Panel Lab Results  Component Value Date/Time   CHOL 125 07/01/2022 10:15 AM   TRIG 59 07/01/2022 10:15 AM   HDL 47 07/01/2022 10:15 AM   CHOLHDL 2.7 07/01/2022 10:15 AM   LDLCALC 65 07/01/2022 10:15 AM    Wt Readings from Last 3 Encounters:  10/29/22 182 lb (82.6 kg)  07/01/22 175 lb (79.4 kg)  05/19/22  178 lb (80.7 kg)     Exam:    Vital Signs:  There were no vitals taken for this visit.    Physical Exam Vitals reviewed.  Constitutional:      General: He is not in acute distress.    Appearance: Normal appearance.  Cardiovascular:     Rate and Rhythm: Normal rate and regular rhythm.     Pulses: Normal pulses.     Heart sounds: Normal heart sounds.  Pulmonary:     Effort: Pulmonary effort is normal. No respiratory distress.     Breath sounds: Normal breath sounds. No wheezing.  Neurological:     Mental Status: He is alert.     ASSESSMENT & PLAN:     1. Benign hypertension with CKD (chronic kidney disease) stage III ([place]) He is reporting having readings slightly elevated but will improve with the recheck, I have advised him to schedule for a nurse visit to have  his machine checked compared to our blood pressure readings. Continue f/u with Cardiology  2. Mixed hyperlipidemia Cholesterol levels are normal.   3. Abnormal glucose HgbA1c has been normal,   4. Vitamin D deficiency Will check with Dr. Royce Macadamia office to see if he has had his vitamin d checked recently.  5. Encounter for immunization He is unable to get his second shingrix here, Rx sent to pharmacy and he is aware.  - Zoster Vaccine Adjuvanted [facility]) injection; Inject 0.5 mLs into the muscle once for 1 dose.  Dispense: 1 each; Refill: 0  COVID-19 Education: The signs and symptoms of COVID-19 were discussed with the patient and how to seek care for testing (follow up with PCP or arrange E-visit).  The importance of social distancing was discussed today.  Patient Risk:   After full review of this patients clinical status, I feel that they are at least moderate risk at this time.  Time:   Today, I have spent 7.37 minutes/ seconds with the patient with telehealth technology discussing above diagnoses.     Medication Adjustments/Labs and Tests Ordered: Current medicines are reviewed at length with the  patient today.  Concerns regarding medicines are outlined above.   Tests Ordered: No orders of the defined types were placed in this encounter.   Medication Changes: Meds ordered this encounter  Medications   Zoster Vaccine Adjuvanted [facility]) injection    Sig: Inject 0.5 mLs into the muscle once for 1 dose.    Dispense:  1 each    Refill:  0    This is his 2nd shingrix vaccine    Disposition:  Follow up prn, has appt in May 2024  Signed, [name], FNP

## 2022-12-11 ENCOUNTER — Other Ambulatory Visit: Payer: Self-pay | Admitting: Nurse Practitioner

## 2022-12-11 DIAGNOSIS — N183 Chronic kidney disease, stage 3 unspecified: Secondary | ICD-10-CM

## 2023-01-19 DIAGNOSIS — N184 Chronic kidney disease, stage 4 (severe): Secondary | ICD-10-CM | POA: Diagnosis not present

## 2023-01-19 LAB — BASIC METABOLIC PANEL
BUN: 37 — AB (ref 4–21)
CO2: 28 — AB (ref 13–22)
Chloride: 106 (ref 99–108)
Creatinine: 2.3 — AB (ref 0.6–1.3)
Glucose: 107
Potassium: 5.1 mEq/L (ref 3.5–5.1)
Sodium: 140 (ref 137–147)

## 2023-01-19 LAB — CBC AND DIFFERENTIAL: Hemoglobin: 10.9 — AB (ref 13.5–17.5)

## 2023-01-19 LAB — PROTEIN / CREATININE RATIO, URINE: Creatinine, Urine: 34.8

## 2023-01-19 LAB — COMPREHENSIVE METABOLIC PANEL
Albumin: 4 (ref 3.5–5.0)
Calcium: 9.9 (ref 8.7–10.7)
eGFR: 28

## 2023-01-25 DIAGNOSIS — D631 Anemia in chronic kidney disease: Secondary | ICD-10-CM | POA: Diagnosis not present

## 2023-01-25 DIAGNOSIS — I129 Hypertensive chronic kidney disease with stage 1 through stage 4 chronic kidney disease, or unspecified chronic kidney disease: Secondary | ICD-10-CM | POA: Diagnosis not present

## 2023-01-25 DIAGNOSIS — N400 Enlarged prostate without lower urinary tract symptoms: Secondary | ICD-10-CM | POA: Diagnosis not present

## 2023-01-25 DIAGNOSIS — E875 Hyperkalemia: Secondary | ICD-10-CM | POA: Diagnosis not present

## 2023-01-25 DIAGNOSIS — R809 Proteinuria, unspecified: Secondary | ICD-10-CM | POA: Diagnosis not present

## 2023-01-25 DIAGNOSIS — N184 Chronic kidney disease, stage 4 (severe): Secondary | ICD-10-CM | POA: Diagnosis not present

## 2023-01-25 DIAGNOSIS — Z87442 Personal history of urinary calculi: Secondary | ICD-10-CM | POA: Diagnosis not present

## 2023-01-25 DIAGNOSIS — E1122 Type 2 diabetes mellitus with diabetic chronic kidney disease: Secondary | ICD-10-CM | POA: Diagnosis not present

## 2023-01-25 DIAGNOSIS — R7303 Prediabetes: Secondary | ICD-10-CM | POA: Diagnosis not present

## 2023-01-30 ENCOUNTER — Other Ambulatory Visit: Payer: Self-pay | Admitting: Nurse Practitioner

## 2023-03-10 ENCOUNTER — Encounter: Payer: Self-pay | Admitting: Nurse Practitioner

## 2023-03-10 ENCOUNTER — Ambulatory Visit: Payer: Medicare PPO | Admitting: Nurse Practitioner

## 2023-03-10 ENCOUNTER — Ambulatory Visit (INDEPENDENT_AMBULATORY_CARE_PROVIDER_SITE_OTHER): Payer: Medicare PPO

## 2023-03-10 VITALS — BP 130/60 | HR 53 | Temp 98.2°F | Ht 68.0 in | Wt 180.0 lb

## 2023-03-10 VITALS — BP 138/60 | HR 53 | Temp 98.2°F | Ht 68.0 in | Wt 180.8 lb

## 2023-03-10 DIAGNOSIS — R7309 Other abnormal glucose: Secondary | ICD-10-CM

## 2023-03-10 DIAGNOSIS — Z0000 Encounter for general adult medical examination without abnormal findings: Secondary | ICD-10-CM

## 2023-03-10 DIAGNOSIS — N183 Chronic kidney disease, stage 3 unspecified: Secondary | ICD-10-CM

## 2023-03-10 DIAGNOSIS — I129 Hypertensive chronic kidney disease with stage 1 through stage 4 chronic kidney disease, or unspecified chronic kidney disease: Secondary | ICD-10-CM | POA: Diagnosis not present

## 2023-03-10 DIAGNOSIS — E782 Mixed hyperlipidemia: Secondary | ICD-10-CM | POA: Diagnosis not present

## 2023-03-10 NOTE — Patient Instructions (Signed)
Mr. Eddie Hutchinson , Thank you for taking time to come for your Medicare Wellness Visit. I appreciate your ongoing commitment to your health goals. Please review the following plan we discussed and let me know if I can assist you in the future.   These are the goals we discussed:  Goals      Exercise 150 min/wk Moderate Activity     Would like to exercise more.       Exercise 150 min/wk Moderate Activity     05/30/2019, patient wants to start exercising     Patient Stated     11/15/2019, wants to stay active     Patient Stated     02/20/2021, no goals     Patient Stated     02/26/2022, stay healthy     Patient Stated     03/10/2023, get BP under control        This is a list of the screening recommended for you and due dates:  Health Maintenance  Topic Date Due   COVID-19 Vaccine (5 - 2023-24 season) 07/10/2022   Flu Shot  06/10/2023   Medicare Annual Wellness Visit  03/09/2024   DTaP/Tdap/Td vaccine (4 - Td or Tdap) 05/17/2029   Pneumonia Vaccine  Completed   Zoster (Shingles) Vaccine  Completed   HPV Vaccine  Aged Out    Advanced directives: Please bring a copy of your POA (Power of [place]) and/or Living Will to your next appointment.   Conditions/risks identified: none  Next appointment: Follow up in one year for your annual wellness visit.   Preventive Care 34 Years and Older, Male  Preventive care refers to lifestyle choices and visits with your health care provider that can promote health and wellness. What does preventive care include? A yearly physical exam. This is also called an annual well check. Dental exams once or twice a year. Routine eye exams. Ask your health care provider how often you should have your eyes checked. Personal lifestyle choices, including: Daily care of your teeth and gums. Regular physical activity. Eating a healthy diet. Avoiding tobacco and drug use. Limiting alcohol use. Practicing safe sex. Taking low doses of aspirin every  day. Taking vitamin and mineral supplements as recommended by your health care provider. What happens during an annual well check? The services and screenings done by your health care provider during your annual well check will depend on your age, overall health, lifestyle risk factors, and family history of disease. Counseling  Your health care provider may ask you questions about your: Alcohol use. Tobacco use. Drug use. Emotional well-being. Home and relationship well-being. Sexual activity. Eating habits. History of falls. Memory and ability to understand (cognition). Work and work Astronomer. Screening  You may have the following tests or measurements: Height, weight, and BMI. Blood pressure. Lipid and cholesterol levels. These may be checked every 5 years, or more frequently if you are over 79 years old. Skin check. Lung cancer screening. You may have this screening every year starting at age 17 if you have a 30-pack-year history of smoking and currently smoke or have quit within the past 15 years. Fecal occult blood test (FOBT) of the stool. You may have this test every year starting at age 36. Flexible sigmoidoscopy or colonoscopy. You may have a sigmoidoscopy every 5 years or a colonoscopy every 10 years starting at age 45. Prostate cancer screening. Recommendations will vary depending on your family history and other risks. Hepatitis C blood test. Hepatitis B blood test.  Sexually transmitted disease (STD) testing. Diabetes screening. This is done by checking your blood sugar (glucose) after you have not eaten for a while (fasting). You may have this done every 1-3 years. Abdominal aortic aneurysm (AAA) screening. You may need this if you are a current or former smoker. Osteoporosis. You may be screened starting at age 65 if you are at high risk. Talk with your health care provider about your test results, treatment options, and if necessary, the need for more  tests. Vaccines  Your health care provider may recommend certain vaccines, such as: Influenza vaccine. This is recommended every year. Tetanus, diphtheria, and acellular pertussis (Tdap, Td) vaccine. You may need a Td booster every 10 years. Zoster vaccine. You may need this after age 80. Pneumococcal 13-valent conjugate (PCV13) vaccine. One dose is recommended after age 29. Pneumococcal polysaccharide (PPSV23) vaccine. One dose is recommended after age 24. Talk to your health care provider about which screenings and vaccines you need and how often you need them. This information is not intended to replace advice given to you by your health care provider. Make sure you discuss any questions you have with your health care provider. Document Released: 11/22/2015 Document Revised: 07/15/2016 Document Reviewed: 08/27/2015 Elsevier Interactive Patient Education  2017 [employer].  Fall Prevention in the Home Falls can cause injuries. They can happen to people of all ages. There are many things you can do to make your home safe and to help prevent falls. What can I do on the outside of my home? Regularly fix the edges of walkways and driveways and fix any cracks. Remove anything that might make you trip as you walk through a door, such as a raised step or threshold. Trim any bushes or trees on the path to your home. Use bright outdoor lighting. Clear any walking paths of anything that might make someone trip, such as rocks or tools. Regularly check to see if handrails are loose or broken. Make sure that both sides of any steps have handrails. Any raised decks and porches should have guardrails on the edges. Have any leaves, snow, or ice cleared regularly. Use sand or salt on walking paths during winter. Clean up any spills in your garage right away. This includes oil or grease spills. What can I do in the bathroom? Use night lights. Install grab bars by the toilet and in the tub and shower.  Do not use towel bars as grab bars. Use non-skid mats or decals in the tub or shower. If you need to sit down in the shower, use a plastic, non-slip stool. Keep the floor dry. Clean up any water that spills on the floor as soon as it happens. Remove soap buildup in the tub or shower regularly. Attach bath mats securely with double-sided non-slip rug tape. Do not have throw rugs and other things on the floor that can make you trip. What can I do in the bedroom? Use night lights. Make sure that you have a light by your bed that is easy to reach. Do not use any sheets or blankets that are too big for your bed. They should not hang down onto the floor. Have a firm chair that has side arms. You can use this for support while you get dressed. Do not have throw rugs and other things on the floor that can make you trip. What can I do in the kitchen? Clean up any spills right away. Avoid walking on wet floors. Keep items that you  use a lot in easy-to-reach places. If you need to reach something above you, use a strong step stool that has a grab bar. Keep electrical cords out of the way. Do not use floor polish or wax that makes floors slippery. If you must use wax, use non-skid floor wax. Do not have throw rugs and other things on the floor that can make you trip. What can I do with my stairs? Do not leave any items on the stairs. Make sure that there are handrails on both sides of the stairs and use them. Fix handrails that are broken or loose. Make sure that handrails are as long as the stairways. Check any carpeting to make sure that it is firmly attached to the stairs. Fix any carpet that is loose or worn. Avoid having throw rugs at the top or bottom of the stairs. If you do have throw rugs, attach them to the floor with carpet tape. Make sure that you have a light switch at the top of the stairs and the bottom of the stairs. If you do not have them, ask someone to add them for you. What else  can I do to help prevent falls? Wear shoes that: Do not have high heels. Have rubber bottoms. Are comfortable and fit you well. Are closed at the toe. Do not wear sandals. If you use a stepladder: Make sure that it is fully opened. Do not climb a closed stepladder. Make sure that both sides of the stepladder are locked into place. Ask someone to hold it for you, if possible. Clearly mark and make sure that you can see: Any grab bars or handrails. First and last steps. Where the edge of each step is. Use tools that help you move around (mobility aids) if they are needed. These include: Canes. Walkers. Scooters. Crutches. Turn on the lights when you go into a dark area. Replace any light bulbs as soon as they burn out. Set up your furniture so you have a clear path. Avoid moving your furniture around. If any of your floors are uneven, fix them. If there are any pets around you, be aware of where they are. Review your medicines with your doctor. Some medicines can make you feel dizzy. This can increase your chance of falling. Ask your doctor what other things that you can do to help prevent falls. This information is not intended to replace advice given to you by your health care provider. Make sure you discuss any questions you have with your health care provider. Document Released: 08/22/2009 Document Revised: 04/02/2016 Document Reviewed: 11/30/2014 Elsevier Interactive Patient Education  2017 [employer].

## 2023-03-10 NOTE — Patient Instructions (Addendum)
Hypertension, Adult ?Hypertension is another name for high blood pressure. High blood pressure forces your heart to work harder to pump blood. This can cause problems over time. ?There are two numbers in a blood pressure reading. There is a top number (systolic) over a bottom number (diastolic). It is best to have a blood pressure that is below 120/80. ?What are the causes? ?The cause of this condition is not known. Some other conditions can lead to high blood pressure. ?What increases the risk? ?Some lifestyle factors can make you more likely to develop high blood pressure: ?Smoking. ?Not getting enough exercise or physical activity. ?Being overweight. ?Having too much fat, sugar, calories, or salt (sodium) in your diet. ?Drinking too much alcohol. ?Other risk factors include: ?Having any of these conditions: ?Heart disease. ?Diabetes. ?High cholesterol. ?Kidney disease. ?Obstructive sleep apnea. ?Having a family history of high blood pressure and high cholesterol. ?Age. The risk increases with age. ?Stress. ?What are the signs or symptoms? ?High blood pressure may not cause symptoms. Very high blood pressure (hypertensive crisis) may cause: ?Headache. ?Fast or uneven heartbeats (palpitations). ?Shortness of breath. ?Nosebleed. ?Vomiting or feeling like you may vomit (nauseous). ?Changes in how you see. ?Very bad chest pain. ?Feeling dizzy. ?Seizures. ?How is this treated? ?This condition is treated by making healthy lifestyle changes, such as: ?Eating healthy foods. ?Exercising more. ?Drinking less alcohol. ?Your doctor may prescribe medicine if lifestyle changes do not help enough and if: ?Your top number is above 130. ?Your bottom number is above 80. ?Your personal target blood pressure may vary. ?Follow these instructions at home: ?Eating and drinking ? ?If told, follow the DASH eating plan. To follow this plan: ?Fill one half of your plate at each meal with fruits and vegetables. ?Fill one fourth of your plate  at each meal with whole grains. Whole grains include whole-wheat pasta, brown rice, and whole-grain bread. ?Eat or drink low-fat dairy products, such as skim milk or low-fat yogurt. ?Fill one fourth of your plate at each meal with low-fat (lean) proteins. Low-fat proteins include fish, chicken without skin, eggs, beans, and tofu. ?Avoid fatty meat, cured and processed meat, or chicken with skin. ?Avoid pre-made or processed food. ?Limit the amount of salt in your diet to less than 1,500 mg each day. ?Do not drink alcohol if: ?Your doctor tells you not to drink. ?You are pregnant, may be pregnant, or are planning to become pregnant. ?If you drink alcohol: ?Limit how much you have to: ?0-1 drink a day for women. ?0-2 drinks a day for men. ?Know how much alcohol is in your drink. In the [place] one drink equals one 12 oz bottle of beer (355 mL), one 5 oz glass of wine (148 mL), or one 1? oz glass of hard liquor (44 mL). ?Lifestyle ? ?Work with your doctor to stay at a healthy weight or to lose weight. Ask your doctor what the best weight is for you. ?Get at least 30 minutes of exercise that causes your heart to beat faster (aerobic exercise) most days of the week. This may include walking, swimming, or biking. ?Get at least 30 minutes of exercise that strengthens your muscles (resistance exercise) at least 3 days a week. This may include lifting weights or doing Pilates. ?Do not smoke or use any products that contain nicotine or tobacco. If you need help quitting, ask your doctor. ?Check your blood pressure at home as told by your doctor. ?Keep all follow-up visits. ?Medicines ?Take over-the-counter and prescription medicines   only as told by your doctor. Follow directions carefully. ?Do not skip doses of blood pressure medicine. The medicine does not work as well if you skip doses. Skipping doses also puts you at risk for problems. ?Ask your doctor about side effects or reactions to medicines that you should watch  for. ?Contact a doctor if: ?You think you are having a reaction to the medicine you are taking. ?You have headaches that keep coming back. ?You feel dizzy. ?You have swelling in your ankles. ?You have trouble with your vision. ?Get help right away if: ?You get a very bad headache. ?You start to feel mixed up (confused). ?You feel weak or numb. ?You feel faint. ?You have very bad pain in your: ?Chest. ?Belly (abdomen). ?You vomit more than once. ?You have trouble breathing. ?These symptoms may be an emergency. Get help right away. Call 911. ?Do not wait to see if the symptoms will go away. ?Do not drive yourself to the hospital. ?Summary ?Hypertension is another name for high blood pressure. ?High blood pressure forces your heart to work harder to pump blood. ?For most people, a normal blood pressure is less than 120/80. ?Making healthy choices can help lower blood pressure. If your blood pressure does not get lower with healthy choices, you may need to take medicine. ?This information is not intended to replace advice given to you by your health care provider. Make sure you discuss any questions you have with your health care provider. ?Document Revised: 08/14/2021 Document Reviewed: 08/14/2021 ?Elsevier Patient Education ? 2023 Elsevier Inc. ? ?

## 2023-03-10 NOTE — Progress Notes (Signed)
Hershal Coria Martin,acting as a Neurosurgeon for Arnette Felts, FNP.,have documented all relevant documentation on the behalf of Arnette Felts, FNP,as directed by  Arnette Felts, FNP while in the presence of Arnette Felts, FNP.   Subjective:     Patient ID: Eddie Hutchinson , male    DOB: 09-Oct-1941 , 82 y.o.   MRN: [id number]   Chief Complaint  Patient presents with   Hypertension    HPI  Patient presents today for a BP check, patient states compliance with medications and has no other concerns today.  He was seen for his AWV. He has seen Dr. Malen Gau[facility] - no changes.   Blood pressure at home readings 136-167/70-90's he is unsure of how old the cuff is.   BP Readings from Last 3 Encounters: 03/10/23 : 130/60 03/10/23 : 138/60 10/29/22 : (!) 128/58    Hypertension This is a chronic problem. The current episode started more than 1 year ago. The problem has been gradually improving since onset. The problem is controlled. Pertinent negatives include no blurred vision, chest pain, headaches, palpitations, peripheral edema or shortness of breath. There are no associated agents to hypertension. Risk factors for coronary artery disease include male gender and family history. Past treatments include alpha 1 blockers and calcium channel blockers. The current treatment provides mild improvement. There are no compliance problems.  Hypertensive end-organ damage includes CAD/MI. There is no history of CVA or left ventricular hypertrophy. Identifiable causes of hypertension include chronic renal disease (stage 2-3 kidney ).     Past Medical History:  Diagnosis Date   Abnormal EKG 11/24/2016   LAFB with LVH and QRS widening   Benign essential HTN 11/24/2016   Family history of early CAD 11/24/2016   Glucose intolerance (impaired glucose tolerance)    Hyperlipidemia 11/24/2016   Hypertension    Kidney stone    PVC's (premature ventricular contractions) 11/24/2016   Vitamin D deficiency       Family History  Problem Relation Age of Onset   Heart failure Mother    Heart attack Father 57   Heart disease Father    CVA Brother    Cancer Brother      Current Outpatient Medications:    amLODipine (NORVASC) 10 MG tablet, TAKE 1 TABLET BY MOUTH EVERY DAY, Disp: 90 tablet, Rfl: 1   carvedilol (COREG) 3.125 MG tablet, Take 1 tablet (3.125 mg total) by mouth 2 (two) times daily., Disp: 180 tablet, Rfl: 3   Ergocalciferol (VITAMIN D2 PO), Take 1,000 Units by mouth daily. , Disp: , Rfl:    fenofibrate 160 MG tablet, TAKE 1 TABLET BY MOUTH EVERY DAY, Disp: 90 tablet, Rfl: 1   ferrous sulfate 325 (65 FE) MG tablet, TAKE 1 TABLET BY MOUTH EVERY DAY, Disp: 90 tablet, Rfl: 1   furosemide (LASIX) 40 MG tablet, Take 40 mg by mouth daily., Disp: , Rfl:    hydrALAZINE (APRESOLINE) 100 MG tablet, Take 100 mg by mouth 3 (three) times daily., Disp: , Rfl:    isosorbide mononitrate (IMDUR) 30 MG 24 hr tablet, Take 1 tablet by mouth daily., Disp: , Rfl:    losartan (COZAAR) 50 MG tablet, Take 50 mg by mouth daily., Disp: , Rfl:    pantoprazole (PROTONIX) 40 MG tablet, Take 1 tablet (40 mg total) by mouth daily., Disp: 30 tablet, Rfl: 0   triamcinolone ointment (KENALOG) 0.5 %, Apply 1 application. topically 2 (two) times daily. (Patient not taking: Reported on 03/10/2023), Disp: 30 g, Rfl: 2  Allergies  Allergen Reactions   Bee Venom Hives    As a child      Review of Systems  Eyes:  Negative for blurred vision.  Respiratory:  Negative for shortness of breath.   Cardiovascular:  Negative for chest pain and palpitations.  Neurological:  Negative for headaches.  All other systems reviewed and are negative.    Today's Vitals   03/10/23 1211  BP: 130/60  Pulse: (!) 53  Temp: 98.2 F (36.8 C)  TempSrc: Oral  Weight: 180 lb (81.6 kg)  Height: 5\' 8"  (1.727 m)  PainSc: 0-No pain   Body mass index is 27.37 kg/m.  Wt Readings from Last 3 Encounters:  03/10/23 180 lb (81.6 kg)  03/10/23  180 lb 12.8 oz (82 kg)  10/29/22 182 lb (82.6 kg)    Objective:  Physical Exam Vitals reviewed.  Constitutional:      General: He is not in acute distress.    Appearance: Normal appearance.  Cardiovascular:     Rate and Rhythm: Normal rate and regular rhythm.     Pulses: Normal pulses.     Heart sounds: Normal heart sounds. No murmur heard. Pulmonary:     Effort: Pulmonary effort is normal. No respiratory distress.     Breath sounds: Normal breath sounds. No wheezing.  Skin:    Capillary Refill: Capillary refill takes less than 2 seconds.  Neurological:     General: No focal deficit present.     Mental Status: He is alert and oriented to person, place, and time.     Cranial Nerves: No cranial nerve deficit.     Motor: No weakness.  Psychiatric:        Mood and Affect: Mood normal.        Behavior: Behavior normal.        Thought Content: Thought content normal.        Judgment: Judgment normal.         Assessment And Plan:     1. Benign hypertension with CKD (chronic kidney disease) stage III (HCC) Comments: Blood pressure is controlled, continue current medications.  2. Mixed hyperlipidemia Comments: Cholesterol levels are normal. Continue current medications and focusing on low fat diet. - Lipid panel  3. Abnormal glucose Comments: HgbA1c is stable. Continue focusing on healthy diet. - Hemoglobin A1c   Patient was given opportunity to ask questions. Patient verbalized understanding of the plan and was able to repeat key elements of the plan. All questions were answered to their satisfaction.  Arnette Felts, FNP   I, Arnette Felts, FNP, have reviewed all documentation for this visit. The documentation on 03/10/23 for the exam, diagnosis, procedures, and orders are all accurate and complete.   IF YOU HAVE BEEN REFERRED TO A SPECIALIST, IT MAY TAKE 1-2 WEEKS TO SCHEDULE/PROCESS THE REFERRAL. IF YOU HAVE NOT HEARD FROM US/SPECIALIST IN TWO WEEKS, PLEASE GIVE [place] A CALL  AT [phone].   THE PATIENT IS ENCOURAGED TO PRACTICE SOCIAL DISTANCING DUE TO THE COVID-19 PANDEMIC.

## 2023-03-10 NOTE — Progress Notes (Signed)
Subjective:   Eddie Hutchinson is a 82 y.o. male who presents for Medicare Annual/Subsequent preventive examination.  Review of Systems     Cardiac Risk Factors include: advanced age (>36men, >43 women);dyslipidemia;hypertension;male gender     Objective:    Today's Vitals   03/10/23 1146 03/10/23 1203  BP: (!) 142/70 138/60  Pulse: (!) 53   Temp: 98.2 F (36.8 C)   TempSrc: Oral   SpO2: 98%   Weight: 180 lb 12.8 oz (82 kg)   Height: 5\' 8"  (1.727 m)    Body mass index is 27.49 kg/m.     03/10/2023   11:52 AM 04/27/2022    2:11 PM 04/12/2022   12:13 PM 02/26/2022   11:01 AM 02/20/2021   10:09 AM 02/17/2020   10:55 AM 11/15/2019    8:46 AM  Advanced Directives  Does Patient Have a Medical Advance Directive? Yes No No Yes No No No  Type of Estate agent of [place];Living will   Healthcare Power of [place];Living will     Copy of Healthcare Power of Attorney in Chart? No - copy requested   No - copy requested     Would patient like information on creating a medical advance directive?  No - Patient declined No - Patient declined  No - Patient declined No - Patient declined No - Patient declined    Current Medications (verified) Outpatient Encounter Medications as of 03/10/2023  Medication Sig   amLODipine (NORVASC) 10 MG tablet TAKE 1 TABLET BY MOUTH EVERY DAY   carvedilol (COREG) 3.125 MG tablet Take 1 tablet (3.125 mg total) by mouth 2 (two) times daily.   Ergocalciferol (VITAMIN D2 PO) Take 1,000 Units by mouth daily.    fenofibrate 160 MG tablet TAKE 1 TABLET BY MOUTH EVERY DAY   ferrous sulfate 325 (65 FE) MG tablet TAKE 1 TABLET BY MOUTH EVERY DAY   furosemide (LASIX) 40 MG tablet Take 40 mg by mouth daily.   hydrALAZINE (APRESOLINE) 100 MG tablet Take 100 mg by mouth 3 (three) times daily.   isosorbide mononitrate (IMDUR) 30 MG 24 hr tablet Take 1 tablet by mouth daily.   losartan (COZAAR) 50 MG tablet Take 50 mg by mouth daily.   pantoprazole  (PROTONIX) 40 MG tablet Take 1 tablet (40 mg total) by mouth daily.   triamcinolone ointment (KENALOG) 0.5 % Apply 1 application. topically 2 (two) times daily. (Patient not taking: Reported on 03/10/2023)   No facility-administered encounter medications on file as of 03/10/2023.    Allergies (verified) Bee venom   History: Past Medical History:  Diagnosis Date   Abnormal EKG 11/24/2016   LAFB with LVH and QRS widening   Benign essential HTN 11/24/2016   Family history of early CAD 11/24/2016   Glucose intolerance (impaired glucose tolerance)    Hyperlipidemia 11/24/2016   Hypertension    Kidney stone    PVC's (premature ventricular contractions) 11/24/2016   Vitamin D deficiency    Past Surgical History:  Procedure Laterality Date   CYSTOSCOPY/RETROGRADE/URETEROSCOPY/STONE EXTRACTION WITH BASKET     KIDNEY STONE SURGERY     LITHOTRIPSY     TOOTH EXTRACTION Left 01/2021   Family History  Problem Relation Age of Onset   Heart failure Mother    Heart attack Father 70   Heart disease Father    CVA Brother    Cancer Brother    Social History   Socioeconomic History   Marital status: Widowed    Spouse  name: Not on file   Number of children: Not on file   Years of education: Not on file   Highest education level: Not on file  Occupational History   Occupation: retired  Tobacco Use   Smoking status: Former   Smokeless tobacco: Never  Building services engineer Use: Never used  Substance and Sexual Activity   Alcohol use: Yes    Comment: occassionally   Drug use: No   Sexual activity: Not Currently  Other Topics Concern   Not on file  Social History Narrative   Not on file   Social Determinants of Health   Financial Resource Strain: Low Risk  (03/10/2023)   Overall Financial Resource Strain (CARDIA)    Difficulty of Paying Living Expenses: Not hard at all  Food Insecurity: No Food Insecurity (03/10/2023)   Hunger Vital Sign    Worried About Running Out of Food in the  Last Year: Never true    Ran Out of Food in the Last Year: Never true  Transportation Needs: No Transportation Needs (03/10/2023)   PRAPARE - Administrator, Civil Service (Medical): No    Lack of Transportation (Non-Medical): No  Physical Activity: Inactive (03/10/2023)   Exercise Vital Sign    Days of Exercise per Week: 0 days    Minutes of Exercise per Session: 0 min  Stress: No Stress Concern Present (03/10/2023)   [employer] of Occupational Health - Occupational Stress Questionnaire    Feeling of Stress : Not at all  Social Connections: Not on file    Tobacco Counseling Counseling given: Not Answered   Clinical Intake:  Pre-visit preparation completed: Yes  Pain : No/denies pain     Nutritional Status: BMI 25 -29 Overweight Nutritional Risks: None Diabetes: No  How often do you need to have someone help you when you read instructions, pamphlets, or other written materials from your doctor or pharmacy?: 1 - Never  Diabetic? no  Interpreter Needed?: No  Information entered by :: NAllen LPN   Activities of Daily Living    03/10/2023   11:53 AM  In your present state of health, do you have any difficulty performing the following activities:  Hearing? 0  Vision? 0  Difficulty concentrating or making decisions? 0  Walking or climbing stairs? 0  Dressing or bathing? 0  Doing errands, shopping? 0  Preparing Food and eating ? N  Using the Toilet? N  In the past six months, have you accidently leaked urine? N  Do you have problems with loss of bowel control? N  Managing your Medications? N  Managing your Finances? N  Housekeeping or managing your Housekeeping? N    Patient Care Team: Arnette Felts, FNP as PCP - General (General Practice) Nahser, Deloris Ping, MD as PCP - Cardiology (Cardiology)  Indicate any recent Medical Services you may have received from other than [facility] providers in the past year (date may be approximate).     Assessment:    This is a routine wellness examination for Eddie Hutchinson.  Hearing/Vision screen Vision Screening - Comments:: No regular eye exams  Dietary issues and exercise activities discussed: Current Exercise Habits: The patient does not participate in regular exercise at present   Goals Addressed             This Visit's Progress    Patient Stated       03/10/2023, get BP under control       Depression Screen    03/10/2023  11:53 AM 11/05/2022    8:50 AM 07/01/2022    9:34 AM 02/26/2022   11:01 AM 02/20/2021   10:10 AM 11/15/2019    8:47 AM 08/09/2019    9:00 AM  PHQ 2/9 Scores  PHQ - 2 Score 0 0 0 0 0 2 1  PHQ- 9 Score      2     Fall Risk    03/10/2023   11:53 AM 11/05/2022    8:50 AM 07/01/2022    9:34 AM 02/26/2022   11:01 AM 02/20/2021   10:10 AM  Fall Risk   Falls in the past year? 0 0 0 0 0  Number falls in past yr: 0 0 0 0   Injury with Fall? 0 0 0 0   Risk for fall due to : Medication side effect No Fall Risks No Fall Risks Medication side effect Medication side effect  Follow up Falls prevention discussed;Education provided;Falls evaluation completed Falls evaluation completed Falls evaluation completed Falls evaluation completed;Education provided;Falls prevention discussed Falls evaluation completed;Education provided;Falls prevention discussed    FALL RISK PREVENTION PERTAINING TO THE HOME:  Any stairs in or around the home? Yes  If so, are there any without handrails? No  Home free of loose throw rugs in walkways, pet beds, electrical cords, etc? Yes  Adequate lighting in your home to reduce risk of falls? Yes   ASSISTIVE DEVICES UTILIZED TO PREVENT FALLS:  Life alert? No  Use of a cane, walker or w/c? No  Grab bars in the bathroom? Yes  Shower chair or bench in shower? No  Elevated toilet seat or a handicapped toilet? No   TIMED UP AND GO:  Was the test performed? Yes .  Length of time to ambulate 10 feet: 5 sec.   Gait steady and fast without use of assistive  device  Cognitive Function:        03/10/2023   11:54 AM 02/26/2022   11:02 AM 02/20/2021   10:11 AM 11/15/2019    8:50 AM 05/30/2019    9:30 AM  6CIT Screen  What Year? 0 points 0 points 0 points 0 points 0 points  What month? 0 points 0 points 0 points 0 points 0 points  What time? 0 points 0 points 0 points 0 points 3 points  Count back from 20 0 points 0 points 0 points 0 points 0 points  Months in reverse 0 points 0 points 0 points 0 points 0 points  Repeat phrase 2 points 0 points 2 points 0 points 0 points  Total Score 2 points 0 points 2 points 0 points 3 points    Immunizations Immunization History  Administered Date(s) Administered   Fluad Quad(high Dose 65+) 09/29/2021, 07/30/2022   Influenza, High Dose Seasonal PF 07/03/2019   Influenza-Unspecified 10/08/2018, 09/18/2020   PFIZER(Purple Top)SARS-COV-2 Vaccination 01/12/2020, 02/07/2020, 10/22/2020   Pfizer Covid-19 Vaccine Bivalent Booster 51yrs & up 10/03/2021   Pneumococcal Conjugate-13 06/01/2019, 06/01/2019   Pneumococcal Polysaccharide-23 09/29/2021   Pneumococcal-Unspecified 07/14/2012   Tdap 05/30/2010, 05/18/2019, 05/18/2019   Zoster Recombinat (Shingrix) 06/24/2021, 11/05/2021    TDAP status: Up to date  Flu Vaccine status: Up to date  Pneumococcal vaccine status: Up to date  Covid-19 vaccine status: Completed vaccines  Qualifies for Shingles Vaccine? Yes   Zostavax completed Yes   Shingrix Completed?: Yes  Screening Tests Health Maintenance  Topic Date Due   COVID-19 Vaccine (5 - 2023-24 season) 07/10/2022   Medicare Annual Wellness (AWV)  02/27/2023  INFLUENZA VACCINE  06/10/2023   DTaP/Tdap/Td (4 - Td or Tdap) 05/17/2029   Pneumonia Vaccine 57+ Years old  Completed   Zoster Vaccines- Shingrix  Completed   HPV VACCINES  Aged Out    Health Maintenance  Health Maintenance Due  Topic Date Due   COVID-19 Vaccine (5 - 2023-24 season) 07/10/2022   Medicare Annual Wellness (AWV)  02/27/2023     Colorectal cancer screening: No longer required.   Lung Cancer Screening: (Low Dose CT Chest recommended if Age 70-80 years, 30 pack-year currently smoking OR have quit w/in 15years.) does not qualify.   Lung Cancer Screening Referral: no  Additional Screening:  Hepatitis C Screening: does not qualify;   Vision Screening: Recommended annual ophthalmology exams for early detection of glaucoma and other disorders of the eye. Is the patient up to date with their annual eye exam?  No  Who is the provider or what is the name of the office in which the patient attends annual eye exams? none If pt is not established with a provider, would they like to be referred to a provider to establish care? No .   Dental Screening: Recommended annual dental exams for proper oral hygiene  Community Resource Referral / Chronic Care Management: CRR required this visit?  No   CCM required this visit?  No      Plan:     I have personally reviewed and noted the following in the patient's chart:   Medical and social history Use of alcohol, tobacco or illicit drugs  Current medications and supplements including opioid prescriptions. Patient is not currently taking opioid prescriptions. Functional ability and status Nutritional status Physical activity Advanced directives List of other physicians Hospitalizations, surgeries, and ER visits in previous 12 months Vitals Screenings to include cognitive, depression, and falls Referrals and appointments  In addition, I have reviewed and discussed with patient certain preventive protocols, quality metrics, and best practice recommendations. A written personalized care plan for preventive services as well as general preventive health recommendations were provided to patient.     Barb Merino, LPN   11/14/1094   Nurse Notes: none

## 2023-03-11 ENCOUNTER — Encounter: Payer: Self-pay | Admitting: Nurse Practitioner

## 2023-03-11 LAB — LIPID PANEL
Chol/HDL Ratio: 2.7 ratio (ref 0.0–5.0)
Cholesterol, Total: 129 mg/dL (ref 100–199)
HDL: 47 mg/dL (ref >39)
LDL Chol Calc (NIH): 68 mg/dL (ref 0–99)
Triglycerides: 65 mg/dL (ref 0–149)
VLDL Cholesterol Cal: 14 mg/dL (ref 5–40)

## 2023-03-11 LAB — HEMOGLOBIN A1C
Est. average glucose Bld gHb Est-mCnc: 103 mg/dL
Hgb A1c MFr Bld: 5.2 % (ref 4.8–5.6)

## 2023-04-08 DIAGNOSIS — Z87891 Personal history of nicotine dependence: Secondary | ICD-10-CM | POA: Diagnosis not present

## 2023-04-08 DIAGNOSIS — N529 Male erectile dysfunction, unspecified: Secondary | ICD-10-CM | POA: Diagnosis not present

## 2023-04-08 DIAGNOSIS — I130 Hypertensive heart and chronic kidney disease with heart failure and stage 1 through stage 4 chronic kidney disease, or unspecified chronic kidney disease: Secondary | ICD-10-CM | POA: Diagnosis not present

## 2023-04-08 DIAGNOSIS — D509 Iron deficiency anemia, unspecified: Secondary | ICD-10-CM | POA: Diagnosis not present

## 2023-04-08 DIAGNOSIS — E785 Hyperlipidemia, unspecified: Secondary | ICD-10-CM | POA: Diagnosis not present

## 2023-04-08 DIAGNOSIS — I509 Heart failure, unspecified: Secondary | ICD-10-CM | POA: Diagnosis not present

## 2023-04-08 DIAGNOSIS — N184 Chronic kidney disease, stage 4 (severe): Secondary | ICD-10-CM | POA: Diagnosis not present

## 2023-04-08 DIAGNOSIS — Z8249 Family history of ischemic heart disease and other diseases of the circulatory system: Secondary | ICD-10-CM | POA: Diagnosis not present

## 2023-04-08 DIAGNOSIS — I2510 Atherosclerotic heart disease of native coronary artery without angina pectoris: Secondary | ICD-10-CM | POA: Diagnosis not present

## 2023-04-17 ENCOUNTER — Other Ambulatory Visit: Payer: Self-pay | Admitting: Nurse Practitioner

## 2023-04-20 DIAGNOSIS — N184 Chronic kidney disease, stage 4 (severe): Secondary | ICD-10-CM | POA: Diagnosis not present

## 2023-04-30 DIAGNOSIS — N184 Chronic kidney disease, stage 4 (severe): Secondary | ICD-10-CM | POA: Diagnosis not present

## 2023-04-30 DIAGNOSIS — I129 Hypertensive chronic kidney disease with stage 1 through stage 4 chronic kidney disease, or unspecified chronic kidney disease: Secondary | ICD-10-CM | POA: Diagnosis not present

## 2023-04-30 DIAGNOSIS — D631 Anemia in chronic kidney disease: Secondary | ICD-10-CM | POA: Diagnosis not present

## 2023-04-30 DIAGNOSIS — R809 Proteinuria, unspecified: Secondary | ICD-10-CM | POA: Diagnosis not present

## 2023-04-30 DIAGNOSIS — N400 Enlarged prostate without lower urinary tract symptoms: Secondary | ICD-10-CM | POA: Diagnosis not present

## 2023-04-30 DIAGNOSIS — R7303 Prediabetes: Secondary | ICD-10-CM | POA: Diagnosis not present

## 2023-04-30 DIAGNOSIS — Z87442 Personal history of urinary calculi: Secondary | ICD-10-CM | POA: Diagnosis not present

## 2023-04-30 DIAGNOSIS — E1122 Type 2 diabetes mellitus with diabetic chronic kidney disease: Secondary | ICD-10-CM | POA: Diagnosis not present

## 2023-04-30 DIAGNOSIS — E875 Hyperkalemia: Secondary | ICD-10-CM | POA: Diagnosis not present

## 2023-05-06 ENCOUNTER — Other Ambulatory Visit: Payer: Self-pay | Admitting: Nurse Practitioner

## 2023-06-08 ENCOUNTER — Other Ambulatory Visit: Payer: Self-pay | Admitting: Nurse Practitioner

## 2023-06-08 DIAGNOSIS — I129 Hypertensive chronic kidney disease with stage 1 through stage 4 chronic kidney disease, or unspecified chronic kidney disease: Secondary | ICD-10-CM

## 2023-06-16 ENCOUNTER — Ambulatory Visit: Payer: Medicare PPO | Admitting: Nurse Practitioner

## 2023-07-05 NOTE — Progress Notes (Unsigned)
Madelaine Bhat, CMA,acting as a Neurosurgeon for Arnette Felts, FNP.,have documented all relevant documentation on the behalf of Arnette Felts, FNP,as directed by  Arnette Felts, FNP while in the presence of Arnette Felts, FNP.  Subjective:   Patient ID: Eddie Hutchinson , male    DOB: 1941-02-10 , 82 y.o.   MRN: [id number]  No chief complaint on file.   HPI  Patient presents today HM, patient reports compliance with medications. Patient denies any chest pain, SOB, and headaches. Patient has no concerns today.     Past Medical History:  Diagnosis Date  . Abnormal EKG 11/24/2016   LAFB with LVH and QRS widening  . Benign essential HTN 11/24/2016  . Family history of early CAD 11/24/2016  . Glucose intolerance (impaired glucose tolerance)   . Hyperlipidemia 11/24/2016  . Hypertension   . Kidney stone   . PVC's (premature ventricular contractions) 11/24/2016  . Vitamin D deficiency      Family History  Problem Relation Age of Onset  . Heart failure Mother   . Heart attack Father 69  . Heart disease Father   . CVA Brother   . Cancer Brother      Current Outpatient Medications:  .  amLODipine (NORVASC) 10 MG tablet, TAKE 1 TABLET BY MOUTH EVERY DAY, Disp: 90 tablet, Rfl: 1 .  carvedilol (COREG) 3.125 MG tablet, TAKE 1 TABLET BY MOUTH 2 TIMES DAILY., Disp: 180 tablet, Rfl: 3 .  Ergocalciferol (VITAMIN D2 PO), Take 1,000 Units by mouth daily. , Disp: , Rfl:  .  fenofibrate 160 MG tablet, TAKE 1 TABLET BY MOUTH EVERY DAY, Disp: 90 tablet, Rfl: 1 .  ferrous sulfate 325 (65 FE) MG tablet, TAKE 1 TABLET BY MOUTH EVERY DAY, Disp: 90 tablet, Rfl: 1 .  furosemide (LASIX) 40 MG tablet, Take 40 mg by mouth daily., Disp: , Rfl:  .  hydrALAZINE (APRESOLINE) 100 MG tablet, Take 100 mg by mouth 3 (three) times daily., Disp: , Rfl:  .  isosorbide mononitrate (IMDUR) 30 MG 24 hr tablet, Take 1 tablet by mouth daily., Disp: , Rfl:  .  losartan (COZAAR) 50 MG tablet, Take 50 mg by mouth daily., Disp: ,  Rfl:  .  pantoprazole (PROTONIX) 40 MG tablet, Take 1 tablet (40 mg total) by mouth daily., Disp: 30 tablet, Rfl: 0 .  triamcinolone ointment (KENALOG) 0.5 %, Apply 1 application. topically 2 (two) times daily. (Patient not taking: Reported on 03/10/2023), Disp: 30 g, Rfl: 2   Allergies  Allergen Reactions  . Bee Venom Hives    As a child      Men's preventive visit. Patient Health Questionnaire (PHQ-2) is  Flowsheet Row Clinical Support from 03/10/2023 in [facility]  PHQ-2 Total Score 0     . Patient is on a *** diet. Marital status: Widowed. Relevant history for alcohol use is:  Social History   Substance and Sexual Activity  Alcohol Use Yes   Comment: occassionally  . Relevant history for tobacco use is:  Social History   Tobacco Use  Smoking Status Former  Smokeless Tobacco Never  .   Review of Systems  Constitutional: Negative.   HENT: Negative.    Eyes: Negative.   Respiratory: Negative.    Cardiovascular: Negative.   Gastrointestinal: Negative.   Endocrine: Negative.   Genitourinary: Negative.   Musculoskeletal: Negative.   Skin: Negative.   Allergic/Immunologic: Negative.   Hematological: Negative.   Psychiatric/Behavioral: Negative.  There were no vitals filed for this visit. There is no height or weight on file to calculate BMI.  Wt Readings from Last 3 Encounters:  03/10/23 180 lb (81.6 kg)  03/10/23 180 lb 12.8 oz (82 kg)  10/29/22 182 lb (82.6 kg)    Objective:  Physical Exam      Assessment And Plan:    Encounter for annual health examination  Benign hypertension with CKD (chronic kidney disease) stage III (HCC)  Mixed hyperlipidemia  Vitamin D deficiency  Abnormal glucose     No follow-ups on file. Patient was given opportunity to ask questions. Patient verbalized understanding of the plan and was able to repeat key elements of the plan. All questions were answered to their satisfaction.    Arnette Felts, FNP  I, Arnette Felts, FNP, have reviewed all documentation for this visit. The documentation on 07/05/23 for the exam, diagnosis, procedures, and orders are all accurate and complete.

## 2023-07-06 ENCOUNTER — Encounter: Payer: Medicare PPO | Admitting: Nurse Practitioner

## 2023-07-06 DIAGNOSIS — N183 Chronic kidney disease, stage 3 unspecified: Secondary | ICD-10-CM

## 2023-07-06 DIAGNOSIS — R7309 Other abnormal glucose: Secondary | ICD-10-CM

## 2023-07-06 DIAGNOSIS — Z0000 Encounter for general adult medical examination without abnormal findings: Secondary | ICD-10-CM

## 2023-07-06 DIAGNOSIS — E559 Vitamin D deficiency, unspecified: Secondary | ICD-10-CM

## 2023-07-06 DIAGNOSIS — E782 Mixed hyperlipidemia: Secondary | ICD-10-CM

## 2023-07-22 ENCOUNTER — Other Ambulatory Visit: Payer: Self-pay | Admitting: Nurse Practitioner

## 2023-07-25 ENCOUNTER — Other Ambulatory Visit: Payer: Self-pay | Admitting: Nurse Practitioner

## 2023-07-26 DIAGNOSIS — N184 Chronic kidney disease, stage 4 (severe): Secondary | ICD-10-CM | POA: Diagnosis not present

## 2023-07-26 DIAGNOSIS — N189 Chronic kidney disease, unspecified: Secondary | ICD-10-CM | POA: Diagnosis not present

## 2023-08-04 DIAGNOSIS — E1122 Type 2 diabetes mellitus with diabetic chronic kidney disease: Secondary | ICD-10-CM | POA: Diagnosis not present

## 2023-08-04 DIAGNOSIS — D631 Anemia in chronic kidney disease: Secondary | ICD-10-CM | POA: Diagnosis not present

## 2023-08-04 DIAGNOSIS — R809 Proteinuria, unspecified: Secondary | ICD-10-CM | POA: Diagnosis not present

## 2023-08-04 DIAGNOSIS — E875 Hyperkalemia: Secondary | ICD-10-CM | POA: Diagnosis not present

## 2023-08-04 DIAGNOSIS — I129 Hypertensive chronic kidney disease with stage 1 through stage 4 chronic kidney disease, or unspecified chronic kidney disease: Secondary | ICD-10-CM | POA: Diagnosis not present

## 2023-08-04 DIAGNOSIS — R7303 Prediabetes: Secondary | ICD-10-CM | POA: Diagnosis not present

## 2023-08-04 DIAGNOSIS — Z23 Encounter for immunization: Secondary | ICD-10-CM | POA: Diagnosis not present

## 2023-08-04 DIAGNOSIS — N184 Chronic kidney disease, stage 4 (severe): Secondary | ICD-10-CM | POA: Diagnosis not present

## 2023-08-04 DIAGNOSIS — Z87442 Personal history of urinary calculi: Secondary | ICD-10-CM | POA: Diagnosis not present

## 2023-10-18 ENCOUNTER — Encounter: Payer: Self-pay | Admitting: Cardiovascular Disease

## 2023-10-18 NOTE — Progress Notes (Unsigned)
Cardiology Office Note:    Date:  10/18/2023   ID:  Eddie Hutchinson, DOB 1941-05-29, MRN [id number]  PCP:  Arnette Felts, FNP  [facility] Cardiologist:  Tilman Neat  [facility] Electrophysiologist:  None   Referring MD: Arnette Felts, FNP   Chief Complaint  Patient presents with   Hypertension       carotid briut  Sept. 13, 2021   Eddie Hutchinson is a 82 y.o. male with a hx of HTN, HLD and a carotid bruit.   We are asked to see him by Arnette Felts, FNP for further eval and management of hisl eft  carotid bruit  He has been seen by Dr. Mayford Knife I the past ( Jan. 2018)  Echo during that evaluation revealed a normal LV EF of 55-60%.  Has mild LAE Stress myoview showed no evidence of ischemia   Lipids from his primary MD were reviewed His LDL is 64.   HDL = 40,  total col = 119 Trigs = 75  No stroke or TIA symptoms.  No CP or dyspea. Fairly active ,   Does his yard work ,  Emergency planning/management officer occasionally  Does not walk , No arm or leg weakness. Has CKD.   Sees Dr. Malen Gauze at [facility]  Sept. 12, 2022: Eddie Hutchinson is seen for follow up of his HTN, HLD, carotid bruit  Is avoiding salty foods   October 29, 2022: Eddie Hutchinson is seen today for follow-up of his hypertension, hyperlipidemia, carotid bruit.  He was admitted to the hospital on June 19 through June 20 for chest pain that was eventually thought to be due to pericarditis.  Treated with colchicine, very rapid steroid taper.  He was instructed to avoid NSAIDs due to his CKD.  VSS today   Feeling well,  no further pleuretic CP   Echocardiogram from April 28, 2022 reveals normal left ventricular systolic function.  He has grade 1 diastolic dysfunction. RV size and function are normal.  There is no evidence of pericardial effusion.   Dec. 10, 2024 Eddie Hutchinson is seen for follow up of his HTN,  HLD, carotid bruits  Had pericarditis last year      Past Medical History:  Diagnosis Date   Abnormal EKG 11/24/2016   LAFB  with LVH and QRS widening   Benign essential HTN 11/24/2016   Family history of early CAD 11/24/2016   Glucose intolerance (impaired glucose tolerance)    Hyperlipidemia 11/24/2016   Hypertension    Kidney stone    PVC's (premature ventricular contractions) 11/24/2016   Vitamin D deficiency     Past Surgical History:  Procedure Laterality Date   CYSTOSCOPY/RETROGRADE/URETEROSCOPY/STONE EXTRACTION WITH BASKET     KIDNEY STONE SURGERY     LITHOTRIPSY     TOOTH EXTRACTION Left 01/2021    Current Medications: No outpatient medications have been marked as taking for the 10/19/23 encounter (Office Visit) with Shayley Medlin, Deloris Ping, MD.     Allergies:   Bee venom   Social History   Socioeconomic History   Marital status: Widowed    Spouse name: Not on file   Number of children: Not on file   Years of education: Not on file   Highest education level: Not on file  Occupational History   Occupation: retired  Tobacco Use   Smoking status: Former   Smokeless tobacco: Never  Vaping Use   Vaping status: Never Used  Substance and Sexual Activity   Alcohol use: Yes  Comment: occassionally   Drug use: No   Sexual activity: Not Currently  Other Topics Concern   Not on file  Social History Narrative   Not on file   Social Determinants of Health   Financial Resource Strain: Low Risk  (03/10/2023)   Overall Financial Resource Strain (CARDIA)    Difficulty of Paying Living Expenses: Not hard at all  Food Insecurity: No Food Insecurity (03/10/2023)   Hunger Vital Sign    Worried About Running Out of Food in the Last Year: Never true    Ran Out of Food in the Last Year: Never true  Transportation Needs: No Transportation Needs (03/10/2023)   PRAPARE - Administrator, Civil Service (Medical): No    Lack of Transportation (Non-Medical): No  Physical Activity: Inactive (03/10/2023)   Exercise Vital Sign    Days of Exercise per Week: 0 days    Minutes of Exercise per Session: 0  min  Stress: No Stress Concern Present (03/10/2023)   [employer] of Occupational Health - Occupational Stress Questionnaire    Feeling of Stress : Not at all  Social Connections: Not on file     Family History: The patient's family history includes CVA in his brother; Cancer in his brother; Heart attack (age of onset: 45) in his father; Heart disease in his father; Heart failure in his mother.  ROS:   Please see the history of present illness.     All other systems reviewed and are negative.  EKGs/Labs/Other Studies Reviewed:    The following studies were reviewed today:    Recent Labs: 01/19/2023: BUN 37; Creatinine 2.3; Hemoglobin 10.9; Potassium 5.1; Sodium 140  Recent Lipid Panel    Component Value Date/Time   CHOL 129 03/10/2023 1239   TRIG 65 03/10/2023 1239   HDL 47 03/10/2023 1239   CHOLHDL 2.7 03/10/2023 1239   LDLCALC 68 03/10/2023 1239    Physical Exam:     Physical Exam: There were no vitals taken for this visit.  No BP recorded.  {Refresh Note OR Click here to enter BP  :1}***    GEN:  Well nourished, well developed in no acute distress HEENT: Normal NECK: No JVD; No carotid bruits LYMPHATICS: No lymphadenopathy CARDIAC: RRR ***, no murmurs, rubs, gallops RESPIRATORY:  Clear to auscultation without rales, wheezing or rhonchi  ABDOMEN: Soft, non-tender, non-distended MUSCULOSKELETAL:  No edema; No deformity  SKIN: Warm and dry NEUROLOGIC:  Alert and oriented x 3    EKG:     ASSESSMENT:    No diagnosis found.    PLAN:     1.   Carotid bruit:       2.  Systolic murmur :       3.  Hyperlipidemia:      4.  HTN:       Medication Adjustments/Labs and Tests Ordered: Current medicines are reviewed at length with the patient today.  Concerns regarding medicines are outlined above.  No orders of the defined types were placed in this encounter.   No orders of the defined types were placed in this encounter.     There are no  Patient Instructions on file for this visit.   Signed, Kristeen Miss, MD  10/18/2023 7:27 PM    [facility]

## 2023-10-19 ENCOUNTER — Encounter: Payer: Self-pay | Admitting: Cardiovascular Disease

## 2023-10-19 ENCOUNTER — Ambulatory Visit: Payer: Medicare PPO | Attending: Cardiovascular Disease | Admitting: Cardiovascular Disease

## 2023-10-19 VITALS — BP 128/45 | HR 53 | Ht 68.0 in | Wt 184.6 lb

## 2023-10-19 DIAGNOSIS — E782 Mixed hyperlipidemia: Secondary | ICD-10-CM

## 2023-10-19 DIAGNOSIS — N184 Chronic kidney disease, stage 4 (severe): Secondary | ICD-10-CM | POA: Diagnosis not present

## 2023-10-19 DIAGNOSIS — I10 Essential (primary) hypertension: Secondary | ICD-10-CM | POA: Diagnosis not present

## 2023-10-19 DIAGNOSIS — R0989 Other specified symptoms and signs involving the circulatory and respiratory systems: Secondary | ICD-10-CM

## 2023-10-19 NOTE — Patient Instructions (Signed)
Testing/Procedures: Carotid Duplex Your physician has requested that you have a carotid duplex. This test is an ultrasound of the carotid arteries in your neck. It looks at blood flow through these arteries that supply the brain with blood. Allow one hour for this exam. There are no restrictions or special instructions.  Follow-Up: At [facility], you and your health needs are our priority.  As part of our continuing mission to provide you with exceptional heart care, we have created designated Provider Care Teams.  These Care Teams include your primary Cardiologist (physician) and Advanced Practice Providers (APPs -  Physician Assistants and Nurse Practitioners) who all work together to provide you with the care you need, when you need it.  Your next appointment:   1 year(s)  Provider:   Kristeen Miss, MD

## 2023-11-01 ENCOUNTER — Ambulatory Visit (HOSPITAL_COMMUNITY)
Admission: RE | Admit: 2023-11-01 | Discharge: 2023-11-01 | Disposition: A | Discharge status: Home / Self Care | Payer: Medicare PPO | Source: Ambulatory Visit | Attending: Cardiology | Admitting: Cardiology

## 2023-11-01 DIAGNOSIS — I10 Essential (primary) hypertension: Secondary | ICD-10-CM | POA: Diagnosis not present

## 2023-11-01 DIAGNOSIS — R0989 Other specified symptoms and signs involving the circulatory and respiratory systems: Secondary | ICD-10-CM

## 2023-11-01 DIAGNOSIS — E782 Mixed hyperlipidemia: Secondary | ICD-10-CM

## 2023-11-01 IMAGING — CT CT CHEST W/O CM
2 of 4 series · 15 of 36 positions shown, 18 images · non-contrast
Comparison: Chest x-ray from the previous day.

CLINICAL DATA: Chest pain and shortness of breath



[Series 3: chest wo · axial · 0.67mm/px · z∈[+856,+1106]mm · 12 of 149 slices shown, 15 images]
[im 12/149  mediastinal]
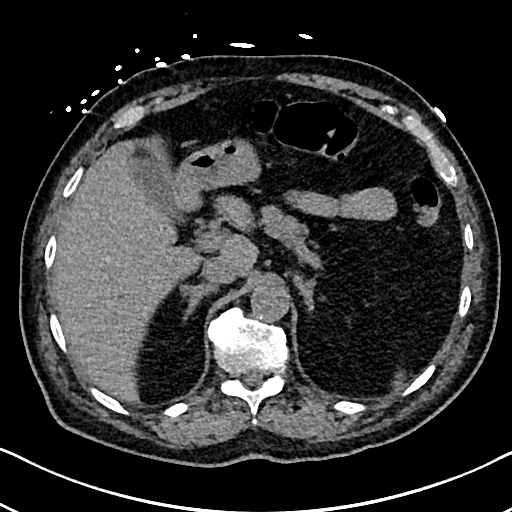
[im 12/149  lung]
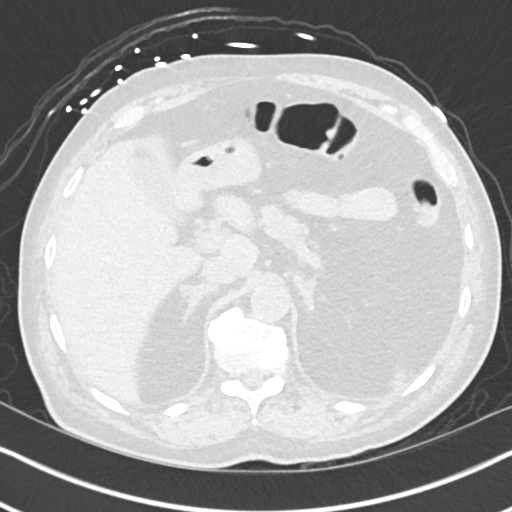
[im 23/149  lung]
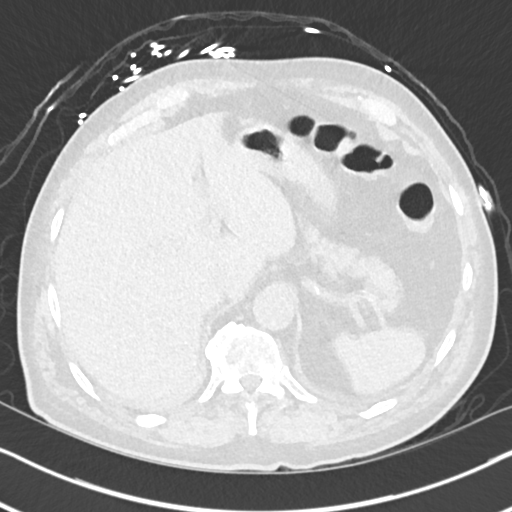
[im 35/149  lung]
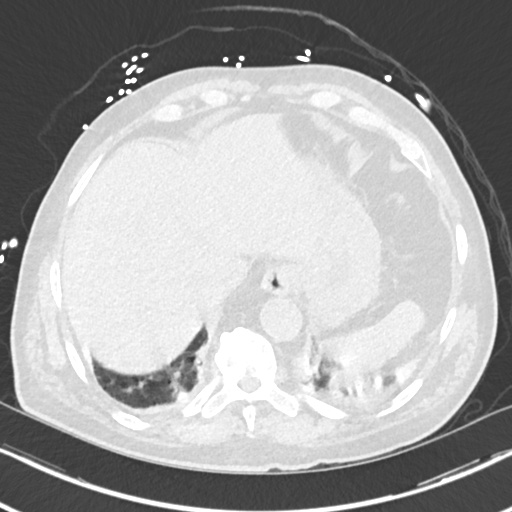
[im 46/149  lung]
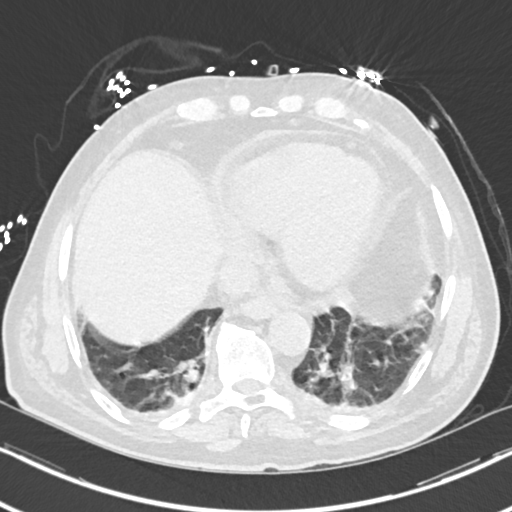
[im 57/149  mediastinal]
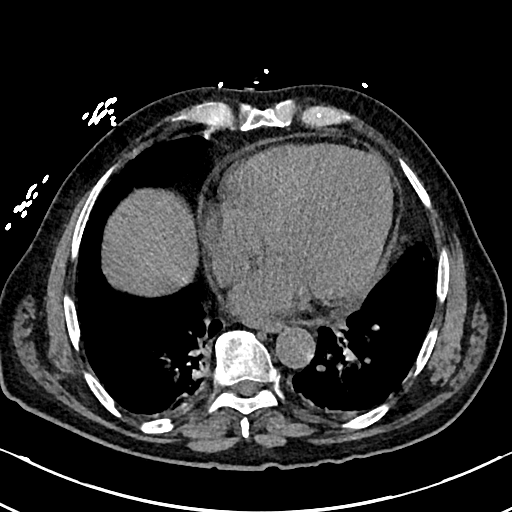
[im 57/149  lung]
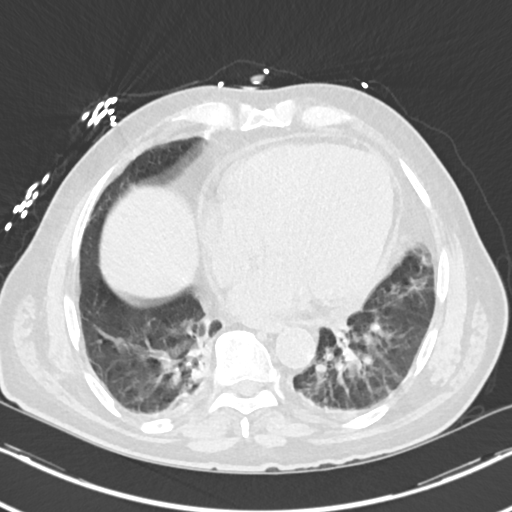
[im 69/149  lung]
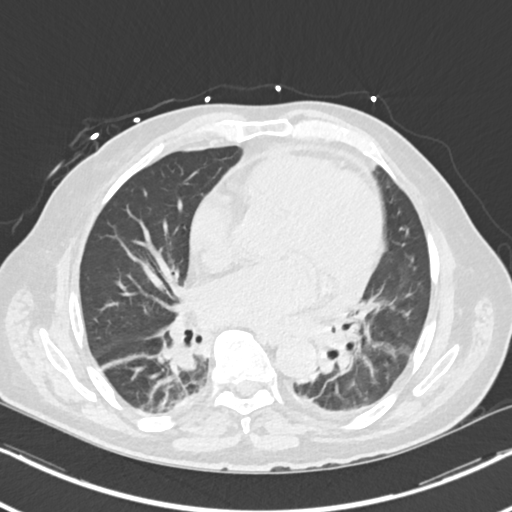
[im 80/149  lung]
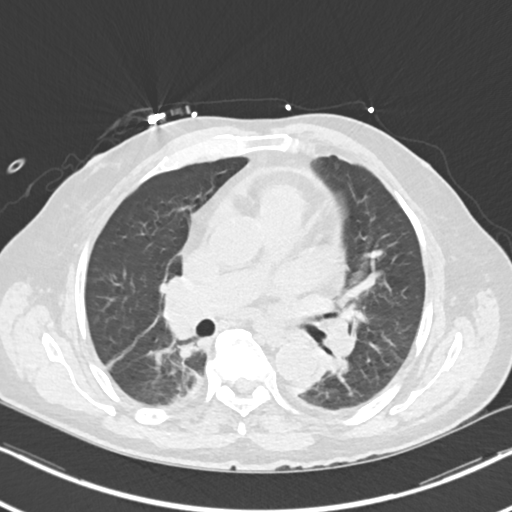
[im 92/149  lung]
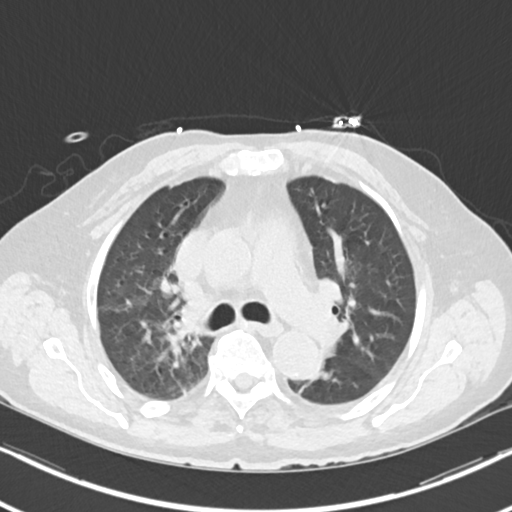
[im 103/149  mediastinal]
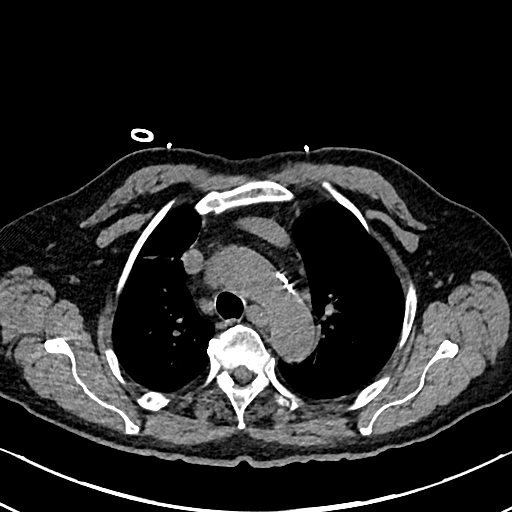
[im 103/149  lung]
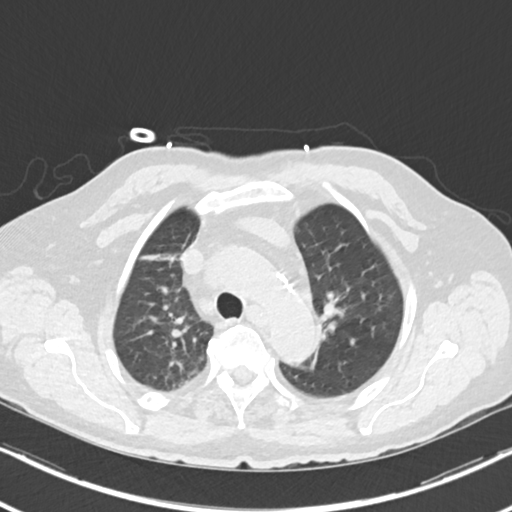
[im 114/149  lung]
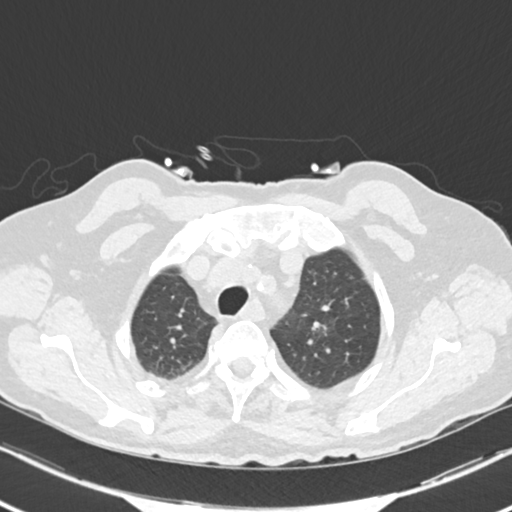
[im 126/149  lung]
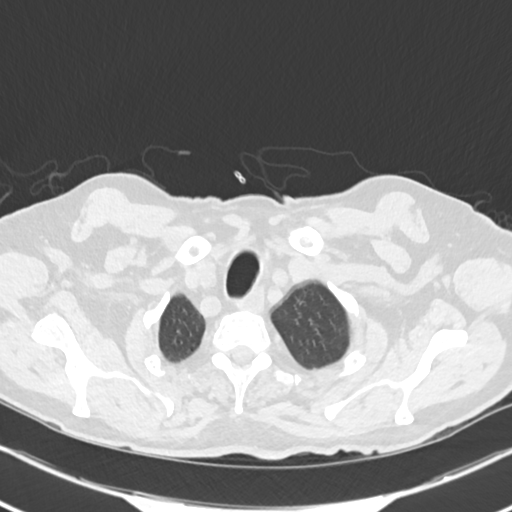
[im 137/149  lung]
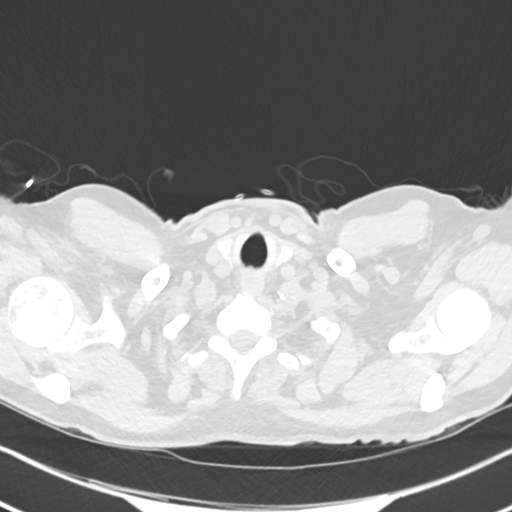

[Series 6: cor · coronal · 0.59mm/px · 3 of 147 slices shown]
[im 30/147  lung]
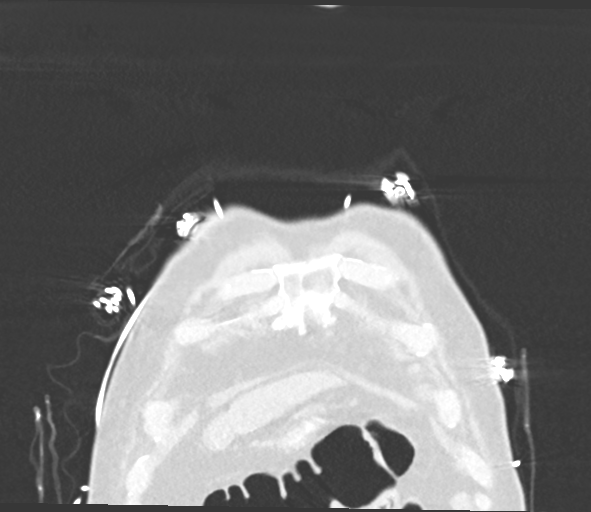
[im 59/147  lung]
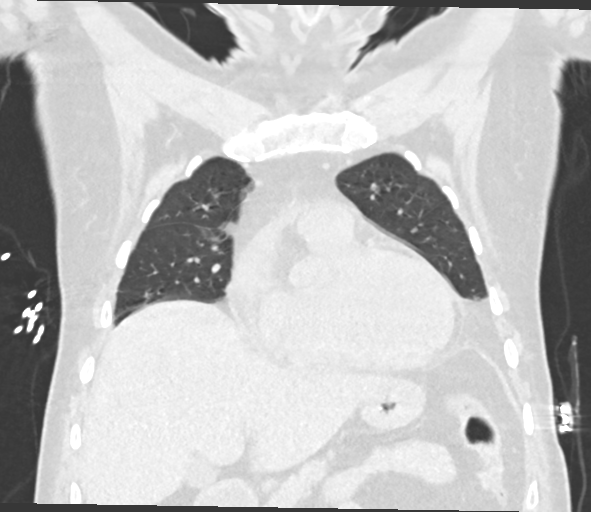
[im 88/147  lung]
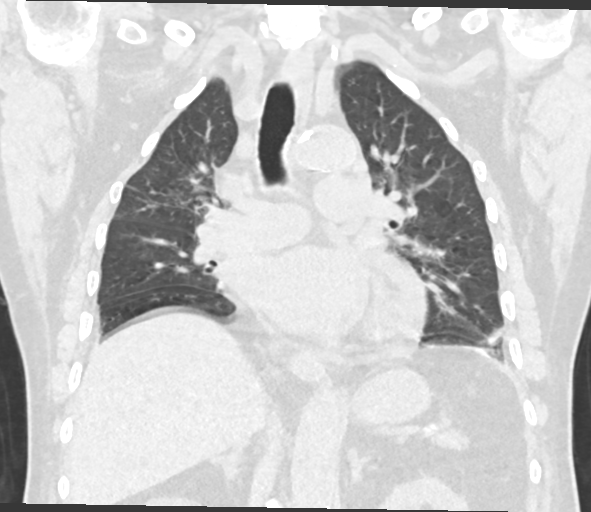

[15 of 36 positions shown; findings below may reference images not displayed]

FINDINGS: Cardiovascular: Somewhat limited due to lack of IV contrast.
Atherosclerotic calcifications of the aorta are seen. No aneurysmal
dilatation is noted. No cardiac enlargement is seen. Mild coronary
calcifications are noted. Pulmonary artery as visualized is within
normal limits.

Mediastinum/Nodes: Thoracic inlet is within normal limits. A few
scattered mediastinal lymph nodes are noted largest of which
measuring up to 10 mm in short axis in the right paratracheal
region. The esophagus as visualized is within normal limits.

Lungs/Pleura: Lungs are well aerated bilaterally. Dependent
atelectatic changes are seen bilaterally. No focal parenchymal
infiltrate or effusion is noted. No parenchymal nodules are seen.

Upper Abdomen: Visualized upper abdomen shows no acute abnormality.

Musculoskeletal: Degenerative changes of the thoracic spine are
noted. No acute rib abnormality is seen.
IMPRESSION: Mild atelectatic changes without focal confluent infiltrate.

Scattered mediastinal nodes measuring up to 1 cm in short axis
likely reactive in nature.

No other acute abnormality is noted.

Aortic Atherosclerosis ([id number].W).

## 2023-11-04 DIAGNOSIS — N184 Chronic kidney disease, stage 4 (severe): Secondary | ICD-10-CM | POA: Diagnosis not present

## 2023-11-04 DIAGNOSIS — N400 Enlarged prostate without lower urinary tract symptoms: Secondary | ICD-10-CM | POA: Diagnosis not present

## 2023-11-04 DIAGNOSIS — R809 Proteinuria, unspecified: Secondary | ICD-10-CM | POA: Diagnosis not present

## 2023-11-04 DIAGNOSIS — I129 Hypertensive chronic kidney disease with stage 1 through stage 4 chronic kidney disease, or unspecified chronic kidney disease: Secondary | ICD-10-CM | POA: Diagnosis not present

## 2023-11-04 DIAGNOSIS — R7303 Prediabetes: Secondary | ICD-10-CM | POA: Diagnosis not present

## 2023-11-04 DIAGNOSIS — Z87442 Personal history of urinary calculi: Secondary | ICD-10-CM | POA: Diagnosis not present

## 2023-11-04 DIAGNOSIS — D631 Anemia in chronic kidney disease: Secondary | ICD-10-CM | POA: Diagnosis not present

## 2023-11-04 DIAGNOSIS — E875 Hyperkalemia: Secondary | ICD-10-CM | POA: Diagnosis not present

## 2023-11-04 DIAGNOSIS — E1122 Type 2 diabetes mellitus with diabetic chronic kidney disease: Secondary | ICD-10-CM | POA: Diagnosis not present

## 2023-12-04 ENCOUNTER — Other Ambulatory Visit: Payer: Self-pay | Admitting: Nurse Practitioner

## 2023-12-04 DIAGNOSIS — I129 Hypertensive chronic kidney disease with stage 1 through stage 4 chronic kidney disease, or unspecified chronic kidney disease: Secondary | ICD-10-CM

## 2024-01-05 DIAGNOSIS — N184 Chronic kidney disease, stage 4 (severe): Secondary | ICD-10-CM | POA: Diagnosis not present

## 2024-01-21 ENCOUNTER — Other Ambulatory Visit: Payer: Self-pay | Admitting: Nurse Practitioner

## 2024-01-29 ENCOUNTER — Other Ambulatory Visit: Payer: Self-pay | Admitting: Nurse Practitioner

## 2024-02-11 DIAGNOSIS — N184 Chronic kidney disease, stage 4 (severe): Secondary | ICD-10-CM | POA: Diagnosis not present

## 2024-02-11 DIAGNOSIS — I130 Hypertensive heart and chronic kidney disease with heart failure and stage 1 through stage 4 chronic kidney disease, or unspecified chronic kidney disease: Secondary | ICD-10-CM | POA: Diagnosis not present

## 2024-02-11 DIAGNOSIS — I129 Hypertensive chronic kidney disease with stage 1 through stage 4 chronic kidney disease, or unspecified chronic kidney disease: Secondary | ICD-10-CM | POA: Diagnosis not present

## 2024-02-11 DIAGNOSIS — N1832 Chronic kidney disease, stage 3b: Secondary | ICD-10-CM | POA: Diagnosis not present

## 2024-02-11 DIAGNOSIS — E1122 Type 2 diabetes mellitus with diabetic chronic kidney disease: Secondary | ICD-10-CM | POA: Diagnosis not present

## 2024-02-11 DIAGNOSIS — R7303 Prediabetes: Secondary | ICD-10-CM | POA: Diagnosis not present

## 2024-02-11 DIAGNOSIS — E875 Hyperkalemia: Secondary | ICD-10-CM | POA: Diagnosis not present

## 2024-02-11 DIAGNOSIS — R809 Proteinuria, unspecified: Secondary | ICD-10-CM | POA: Diagnosis not present

## 2024-02-11 DIAGNOSIS — D631 Anemia in chronic kidney disease: Secondary | ICD-10-CM | POA: Diagnosis not present

## 2024-02-14 ENCOUNTER — Other Ambulatory Visit: Payer: Self-pay | Admitting: Nurse Practitioner

## 2024-02-14 ENCOUNTER — Other Ambulatory Visit: Payer: Self-pay | Admitting: Registered Nurse

## 2024-02-14 DIAGNOSIS — N184 Chronic kidney disease, stage 4 (severe): Secondary | ICD-10-CM

## 2024-02-15 ENCOUNTER — Other Ambulatory Visit: Payer: Self-pay

## 2024-02-15 MED ORDER — FENOFIBRATE 160 MG PO TABS: 160.0000 mg | ORAL_TABLET | Freq: Every day | ORAL | 0 refills | Status: DC

## 2024-02-17 ENCOUNTER — Ambulatory Visit
Admission: RE | Admit: 2024-02-17 | Discharge: 2024-02-17 | Discharge status: Home / Self Care | Source: Ambulatory Visit | Attending: Registered Nurse | Admitting: Registered Nurse

## 2024-02-17 DIAGNOSIS — N189 Chronic kidney disease, unspecified: Secondary | ICD-10-CM | POA: Diagnosis not present

## 2024-02-17 DIAGNOSIS — N184 Chronic kidney disease, stage 4 (severe): Secondary | ICD-10-CM

## 2024-03-02 ENCOUNTER — Encounter: Payer: Self-pay | Admitting: Nurse Practitioner

## 2024-03-02 ENCOUNTER — Ambulatory Visit: Admitting: Nurse Practitioner

## 2024-03-02 VITALS — BP 150/60 | HR 54 | Temp 98.5°F | Ht 68.0 in | Wt 180.2 lb

## 2024-03-02 DIAGNOSIS — R7309 Other abnormal glucose: Secondary | ICD-10-CM | POA: Diagnosis not present

## 2024-03-02 DIAGNOSIS — L299 Pruritus, unspecified: Secondary | ICD-10-CM

## 2024-03-02 DIAGNOSIS — I129 Hypertensive chronic kidney disease with stage 1 through stage 4 chronic kidney disease, or unspecified chronic kidney disease: Secondary | ICD-10-CM | POA: Diagnosis not present

## 2024-03-02 DIAGNOSIS — E663 Overweight: Secondary | ICD-10-CM

## 2024-03-02 DIAGNOSIS — E782 Mixed hyperlipidemia: Secondary | ICD-10-CM | POA: Diagnosis not present

## 2024-03-02 DIAGNOSIS — I700 Atherosclerosis of aorta: Secondary | ICD-10-CM | POA: Diagnosis not present

## 2024-03-02 DIAGNOSIS — Z6827 Body mass index (BMI) 27.0-27.9, adult: Secondary | ICD-10-CM | POA: Diagnosis not present

## 2024-03-02 DIAGNOSIS — N183 Chronic kidney disease, stage 3 unspecified: Secondary | ICD-10-CM

## 2024-03-02 MED ORDER — FENOFIBRATE 160 MG PO TABS: 160.0000 mg | ORAL_TABLET | Freq: Every day | ORAL | 1 refills | Status: DC

## 2024-03-02 NOTE — Progress Notes (Signed)
 [place], CMA,acting as a Neurosurgeon for Susanna Epley, FNP.,have documented all relevant documentation on the behalf of Susanna Epley, FNP,as directed by  Susanna Epley, FNP while in the presence of Susanna Epley, FNP.  Subjective:  Patient ID: Eddie Hutchinson , male    DOB: February 10, 1941 , 83 y.o.   MRN: [id number]  Chief Complaint  Patient presents with   Hypertension    HPI  Patient presents today for a bp and chol follow up, Patient reports compliance with medication. Patient denies any chest pain, SOB, or headaches. Exercises with yard work and some golf. He reports hs has been taking his medications as directed.  Patient has no concerns today.     Past Medical History:  Diagnosis Date   Abnormal EKG 11/24/2016   LAFB with LVH and QRS widening   Benign essential HTN 11/24/2016   Family history of early CAD 11/24/2016   Glucose intolerance (impaired glucose tolerance)    Hyperlipidemia 11/24/2016   Hypertension    Kidney stone    PVC's (premature ventricular contractions) 11/24/2016   Vitamin D  deficiency      Family History  Problem Relation Age of Onset   Heart failure Mother    Heart attack Father 69   Heart disease Father    CVA Brother    Cancer Brother      Current Outpatient Medications:    amLODipine  (NORVASC ) 10 MG tablet, TAKE 1 TABLET BY MOUTH EVERY DAY, Disp: 90 tablet, Rfl: 1   carvedilol  (COREG ) 3.125 MG tablet, TAKE 1 TABLET BY MOUTH 2 TIMES DAILY., Disp: 180 tablet, Rfl: 3   Ergocalciferol  (VITAMIN D2 PO), Take 1,000 Units by mouth daily. , Disp: , Rfl:    ferrous sulfate  325 (65 FE) MG tablet, TAKE 1 TABLET BY MOUTH EVERY DAY, Disp: 90 tablet, Rfl: 1   furosemide  (LASIX ) 40 MG tablet, Take 40 mg by mouth daily., Disp: , Rfl:    hydrALAZINE  (APRESOLINE ) 100 MG tablet, Take 100 mg by mouth 3 (three) times daily., Disp: , Rfl:    isosorbide  mononitrate (IMDUR ) 30 MG 24 hr tablet, Take 1 tablet by mouth daily., Disp: , Rfl:    losartan (COZAAR) 50 MG  tablet, Take 50 mg by mouth daily., Disp: , Rfl:    fenofibrate  160 MG tablet, Take 1 tablet (160 mg total) by mouth daily., Disp: 90 tablet, Rfl: 1   pantoprazole  (PROTONIX ) 40 MG tablet, Take 1 tablet (40 mg total) by mouth daily., Disp: 30 tablet, Rfl: 0   Allergies  Allergen Reactions   Bee Venom Hives    As a child      Review of Systems  Respiratory:  Negative for shortness of breath.   Cardiovascular:  Negative for chest pain and palpitations.  Skin:        Dry and itching to bilateral lower extremities.   Neurological:  Negative for headaches.  Psychiatric/Behavioral: Negative.    All other systems reviewed and are negative.    Today's Vitals   03/02/24 1114 03/02/24 1134  BP: (!) 160/60 (!) 150/60  Pulse: (!) 54   Temp: 98.5 F (36.9 C)   TempSrc: Oral   Weight: 180 lb 3.2 oz (81.7 kg)   Height: 5\' 8"  (1.727 m)   PainSc: 0-No pain    Body mass index is 27.4 kg/m.  Wt Readings from Last 3 Encounters:  03/02/24 180 lb 3.2 oz (81.7 kg)  10/19/23 184 lb 9.6 oz (83.7 kg)  03/10/23 180 lb (81.6  kg)     Objective:  Physical Exam Vitals and nursing note reviewed.  Constitutional:      General: He is not in acute distress.    Appearance: Normal appearance.  Cardiovascular:     Rate and Rhythm: Normal rate and regular rhythm.     Pulses: Normal pulses.     Heart sounds: Normal heart sounds. No murmur heard. Pulmonary:     Effort: Pulmonary effort is normal. No respiratory distress.     Breath sounds: Normal breath sounds. No wheezing.  Musculoskeletal:     Right lower leg: Edema (trace) present.     Left lower leg: Edema (trace) present.  Skin:    General: Skin is warm and dry.     Capillary Refill: Capillary refill takes less than 2 seconds.     Findings: No erythema or rash.  Neurological:     General: No focal deficit present.     Mental Status: He is alert and oriented to person, place, and time.     Cranial Nerves: No cranial nerve deficit.      Motor: No weakness.  Psychiatric:        Mood and Affect: Mood normal.        Behavior: Behavior normal.        Thought Content: Thought content normal.        Judgment: Judgment normal.        Assessment And Plan:  Benign hypertension with CKD (chronic kidney disease) stage III (HCC) Assessment & Plan: Blood pressure is elevated, repeat is better. Continue current medications and f/u with Cardiology   Mixed hyperlipidemia Assessment & Plan: Cholesterol levels are stable, continue current medications.   Orders: -     Lipid panel -     Fenofibrate ; Take 1 tablet (160 mg total) by mouth daily.  Dispense: 90 tablet; Refill: 1  Abnormal glucose Assessment & Plan: HgbA1c was normal. Will check A1c. Continue focusing on healthy diet and regular exercise.   Orders: -     Hemoglobin A1c  Atherosclerosis of aorta (HCC) Assessment & Plan: Continue cholesterol medication, tolerating well.    Pruritus Assessment & Plan: To bilateral lower extremities, no rash present. Advised to use benadryl cream if not better return call to office. Will also check liver functions  Orders: -     CMP14+EGFR -     TSH  Overweight with body mass index (BMI) of 27 to 27.9 in adult   Return for 6 month bp check and HM.  Patient was given opportunity to ask questions. Patient verbalized understanding of the plan and was able to repeat key elements of the plan. All questions were answered to their satisfaction.    Inge Mangle, FNP, have reviewed all documentation for this visit. The documentation on 03/02/24 for the exam, diagnosis, procedures, and orders are all accurate and complete.   IF YOU HAVE BEEN REFERRED TO A SPECIALIST, IT MAY TAKE 1-2 WEEKS TO SCHEDULE/PROCESS THE REFERRAL. IF YOU HAVE NOT HEARD FROM US /SPECIALIST IN TWO WEEKS, PLEASE GIVE US  A CALL AT [phone].

## 2024-03-02 NOTE — Assessment & Plan Note (Signed)
 Continue cholesterol medication, tolerating well.

## 2024-03-02 NOTE — Assessment & Plan Note (Signed)
 HgbA1c was normal. Will check A1c. Continue focusing on healthy diet and regular exercise.

## 2024-03-02 NOTE — Assessment & Plan Note (Signed)
 Cholesterol levels are stable, continue current medications.

## 2024-03-02 NOTE — Assessment & Plan Note (Signed)
 Blood pressure is elevated, repeat is better. Continue current medications and f/u with Cardiology

## 2024-03-02 NOTE — Assessment & Plan Note (Signed)
 To bilateral lower extremities, no rash present. Advised to use benadryl cream if not better return call to office. Will also check liver functions

## 2024-03-03 LAB — CMP14+EGFR
ALT: 7 IU/L (ref 0–44)
AST: 13 IU/L (ref 0–40)
Albumin: 3.6 g/dL — ABNORMAL LOW (ref 3.7–4.7)
Alkaline Phosphatase: 38 IU/L — ABNORMAL LOW (ref 44–121)
BUN/Creatinine Ratio: 10 (ref 10–24)
BUN: 22 mg/dL (ref 8–27)
Bilirubin Total: 0.3 mg/dL (ref 0.0–1.2)
CO2: 21 mmol/L (ref 20–29)
Calcium: 9 mg/dL (ref 8.6–10.2)
Chloride: 109 mmol/L — ABNORMAL HIGH (ref 96–106)
Creatinine, Ser: 2.24 mg/dL — ABNORMAL HIGH (ref 0.76–1.27)
Globulin, Total: 2.4 g/dL (ref 1.5–4.5)
Glucose: 90 mg/dL (ref 70–99)
Potassium: 5.2 mmol/L (ref 3.5–5.2)
Sodium: 141 mmol/L (ref 134–144)
Total Protein: 6 g/dL (ref 6.0–8.5)
eGFR: 29 mL/min/1.73 — ABNORMAL LOW (ref >59)

## 2024-03-03 LAB — HEMOGLOBIN A1C
Est. average glucose Bld gHb Est-mCnc: 103 mg/dL
Hgb A1c MFr Bld: 5.2 % (ref 4.8–5.6)

## 2024-03-03 LAB — TSH: TSH: 2.49 u[IU]/mL (ref 0.450–4.500)

## 2024-03-03 LAB — LIPID PANEL
Chol/HDL Ratio: 2.7 ratio (ref 0.0–5.0)
Cholesterol, Total: 118 mg/dL (ref 100–199)
HDL: 43 mg/dL (ref >39)
LDL Chol Calc (NIH): 62 mg/dL (ref 0–99)
Triglycerides: 61 mg/dL (ref 0–149)
VLDL Cholesterol Cal: 13 mg/dL (ref 5–40)

## 2024-03-15 ENCOUNTER — Ambulatory Visit: Payer: Medicare PPO

## 2024-03-15 VITALS — BP 128/60 | HR 66 | Temp 98.5°F | Ht 68.0 in | Wt 179.0 lb

## 2024-03-15 DIAGNOSIS — Z0000 Encounter for general adult medical examination without abnormal findings: Secondary | ICD-10-CM | POA: Diagnosis not present

## 2024-03-15 NOTE — Patient Instructions (Signed)
 Eddie Hutchinson , Thank you for taking time to come for your Medicare Wellness Visit. I appreciate your ongoing commitment to your health goals. Please review the following plan we discussed and let me know if I can assist you in the future.   Referrals/Orders/Follow-Ups/Clinician Recommendations: none  This is a list of the screening recommended for you and due dates:  Health Maintenance  Topic Date Due   COVID-19 Vaccine (7 - Pfizer risk 2024-25 season) 03/10/2024   Flu Shot  06/09/2024   Medicare Annual Wellness Visit  03/15/2025   DTaP/Tdap/Td vaccine (4 - Td or Tdap) 05/17/2029   Pneumonia Vaccine  Completed   Zoster (Shingles) Vaccine  Completed   HPV Vaccine  Aged Out   Meningitis B Vaccine  Aged Out   Hepatitis C Screening  Discontinued    Advanced directives: (Copy Requested) Please bring a copy of your health care power of attorney and living will to the office to be added to your chart at your convenience. You can mail to [facility] [address] [place], [place] [address] or email to [email or web address]  Next Medicare Annual Wellness Visit scheduled for next year: No, office will schedule  Have you seen your provider in the last 6 months (3 months if uncontrolled diabetes)? Yes, appointment 09/11/2024  insert Preventive Care attachment Insert FALL PREVENTION attachment if needed

## 2024-03-15 NOTE — Progress Notes (Signed)
 Subjective:   Eddie Hutchinson is a 83 y.o. who presents for a Medicare Wellness preventive visit.  Visit Complete: In person    Persons Participating in Visit: Patient.  AWV Questionnaire: No: Patient Medicare AWV questionnaire was not completed prior to this visit.  Cardiac Risk Factors include: advanced age (>63men, >38 women);dyslipidemia;hypertension;male gender     Objective:    Today's Vitals   03/15/24 0820 03/15/24 0830  BP: (!) 150/60 128/60  Pulse: 66   Temp: 98.5 F (36.9 C)   TempSrc: Oral   SpO2: 98%   Weight: 179 lb (81.2 kg)   Height: 5\' 8"  (1.727 m)    Body mass index is 27.22 kg/m.     03/15/2024    8:25 AM 03/10/2023   11:52 AM 04/27/2022    2:11 PM 04/12/2022   12:13 PM 02/26/2022   11:01 AM 02/20/2021   10:09 AM 02/17/2020   10:55 AM  Advanced Directives  Does Patient Have a Medical Advance Directive? Yes Yes No No Yes No No  Type of Estate agent of [place];Living will Healthcare Power of [place];Living will   Healthcare Power of [place];Living will    Copy of Healthcare Power of Attorney in Chart? No - copy requested No - copy requested   No - copy requested    Would patient like information on creating a medical advance directive?   No - Patient declined No - Patient declined  No - Patient declined No - Patient declined    Current Medications (verified) Outpatient Encounter Medications as of 03/15/2024  Medication Sig   amLODipine  (NORVASC ) 10 MG tablet TAKE 1 TABLET BY MOUTH EVERY DAY   carvedilol  (COREG ) 3.125 MG tablet TAKE 1 TABLET BY MOUTH 2 TIMES DAILY.   Ergocalciferol  (VITAMIN D2 PO) Take 1,000 Units by mouth daily.    fenofibrate  160 MG tablet Take 1 tablet (160 mg total) by mouth daily.   ferrous sulfate  325 (65 FE) MG tablet TAKE 1 TABLET BY MOUTH EVERY DAY   furosemide  (LASIX ) 40 MG tablet Take 40 mg by mouth daily.   hydrALAZINE  (APRESOLINE ) 100 MG tablet Take 100 mg by mouth 3 (three) times daily.    isosorbide  mononitrate (IMDUR ) 30 MG 24 hr tablet Take 1 tablet by mouth daily.   losartan (COZAAR) 50 MG tablet Take 50 mg by mouth daily.   pantoprazole  (PROTONIX ) 40 MG tablet Take 1 tablet (40 mg total) by mouth daily.   No facility-administered encounter medications on file as of 03/15/2024.    Allergies (verified) Bee venom   History: Past Medical History:  Diagnosis Date   Abnormal EKG 11/24/2016   LAFB with LVH and QRS widening   Benign essential HTN 11/24/2016   Family history of early CAD 11/24/2016   Glucose intolerance (impaired glucose tolerance)    Hyperlipidemia 11/24/2016   Hypertension    Kidney stone    PVC's (premature ventricular contractions) 11/24/2016   Vitamin D  deficiency    Past Surgical History:  Procedure Laterality Date   CYSTOSCOPY/RETROGRADE/URETEROSCOPY/STONE EXTRACTION WITH BASKET     KIDNEY STONE SURGERY     LITHOTRIPSY     TOOTH EXTRACTION Left 01/2021   Family History  Problem Relation Age of Onset   Heart failure Mother    Heart attack Father 71   Heart disease Father    CVA Brother    Cancer Brother    Social History   Socioeconomic History   Marital status: Widowed    Spouse name:  Not on file   Number of children: Not on file   Years of education: Not on file   Highest education level: Not on file  Occupational History   Occupation: retired  Tobacco Use   Smoking status: Former   Smokeless tobacco: Never  Advertising account planner   Vaping status: Never Used  Substance and Sexual Activity   Alcohol use: Yes    Comment: occassionally   Drug use: No   Sexual activity: Not Currently  Other Topics Concern   Not on file  Social History Narrative   Not on file   Social Drivers of Health   Financial Resource Strain: Low Risk  (03/15/2024)   Overall Financial Resource Strain (CARDIA)    Difficulty of Paying Living Expenses: Not hard at all  Food Insecurity: No Food Insecurity (03/15/2024)   Hunger Vital Sign    Worried About Running Out  of Food in the Last Year: Never true    Ran Out of Food in the Last Year: Never true  Transportation Needs: No Transportation Needs (03/15/2024)   PRAPARE - Administrator, Civil Service (Medical): No    Lack of Transportation (Non-Medical): No  Physical Activity: Inactive (03/15/2024)   Exercise Vital Sign    Days of Exercise per Week: 0 days    Minutes of Exercise per Session: 0 min  Stress: No Stress Concern Present (03/15/2024)   [employer] of Occupational Health - Occupational Stress Questionnaire    Feeling of Stress : Not at all  Social Connections: Socially Isolated (03/15/2024)   Social Connection and Isolation Panel [NHANES]    Frequency of Communication with Friends and Family: Three times a week    Frequency of Social Gatherings with Friends and Family: Three times a week    Attends Religious Services: Never    Active Member of Clubs or Organizations: No    Attends Banker Meetings: Never    Marital Status: Widowed    Tobacco Counseling Counseling given: Not Answered    Clinical Intake:  Pre-visit preparation completed: Yes  Pain : No/denies pain     Nutritional Status: BMI 25 -29 Overweight Nutritional Risks: None Diabetes: No  Lab Results  Component Value Date   HGBA1C 5.2 03/02/2024   HGBA1C 5.2 03/10/2023   HGBA1C 5.3 02/20/2021     How often do you need to have someone help you when you read instructions, pamphlets, or other written materials from your doctor or pharmacy?: 1 - Never  Interpreter Needed?: No  Information entered by :: NAllen LPN   Activities of Daily Living     03/15/2024    8:21 AM  In your present state of health, do you have any difficulty performing the following activities:  Hearing? 0  Vision? 0  Difficulty concentrating or making decisions? 0  Walking or climbing stairs? 0  Dressing or bathing? 0  Doing errands, shopping? 0  Preparing Food and eating ? N  Using the Toilet? N  In the past  six months, have you accidently leaked urine? N  Do you have problems with loss of bowel control? N  Managing your Medications? N  Managing your Finances? N  Housekeeping or managing your Housekeeping? N    Patient Care Team: Susanna Epley, FNP as PCP - General (General Practice) Nahser, Lela Purple, MD as PCP - Cardiology (Cardiology)  Indicate any recent Medical Services you may have received from other than [facility] providers in the past year (date may be approximate).  Assessment:   This is a routine wellness examination for Eddie Hutchinson.  Hearing/Vision screen Hearing Screening - Comments:: Denies hearing issues Vision Screening - Comments:: No regular eye exams   Goals Addressed             This Visit's Progress    Patient Stated       03/15/2024, denies goals       Depression Screen     03/15/2024    8:26 AM 03/10/2023   11:53 AM 11/05/2022    8:50 AM 07/01/2022    9:34 AM 02/26/2022   11:01 AM 02/20/2021   10:10 AM 11/15/2019    8:47 AM  PHQ 2/9 Scores  PHQ - 2 Score 0 0 0 0 0 0 2  PHQ- 9 Score 0      2    Fall Risk     03/15/2024    8:25 AM 03/10/2023   11:53 AM 11/05/2022    8:50 AM 07/01/2022    9:34 AM 02/26/2022   11:01 AM  Fall Risk   Falls in the past year? 0 0 0 0 0  Number falls in past yr: 0 0 0 0 0  Injury with Fall? 0 0 0 0 0  Risk for fall due to : Medication side effect Medication side effect No Fall Risks No Fall Risks Medication side effect  Follow up Falls prevention discussed;Falls evaluation completed Falls prevention discussed;Education provided;Falls evaluation completed Falls evaluation completed Falls evaluation completed Falls evaluation completed;Education provided;Falls prevention discussed    MEDICARE RISK AT HOME:  Medicare Risk at Home Any stairs in or around the home?: No If so, are there any without handrails?: No Home free of loose throw rugs in walkways, pet beds, electrical cords, etc?: Yes Adequate lighting in your home to reduce  risk of falls?: Yes Life alert?: No Use of a cane, walker or w/c?: No Grab bars in the bathroom?: No Shower chair or bench in shower?: No Elevated toilet seat or a handicapped toilet?: Yes  TIMED UP AND GO:  Was the test performed?  Yes  Length of time to ambulate 10 feet: 5 sec Gait steady and fast without use of assistive device  Cognitive Function: 6CIT completed        03/15/2024    8:26 AM 03/10/2023   11:54 AM 02/26/2022   11:02 AM 02/20/2021   10:11 AM 11/15/2019    8:50 AM  6CIT Screen  What Year? 0 points 0 points 0 points 0 points 0 points  What month? 0 points 0 points 0 points 0 points 0 points  What time? 0 points 0 points 0 points 0 points 0 points  Count back from 20 0 points 0 points 0 points 0 points 0 points  Months in reverse 0 points 0 points 0 points 0 points 0 points  Repeat phrase 0 points 2 points 0 points 2 points 0 points  Total Score 0 points 2 points 0 points 2 points 0 points    Immunizations Immunization History  Administered Date(s) Administered   Fluad Quad(high Dose 65+) 09/29/2021, 07/30/2022   Influenza, High Dose Seasonal PF 07/03/2019, 08/04/2023   Influenza-Unspecified 10/08/2018, 09/18/2020   Moderna Covid-19 Fall Seasonal Vaccine 46yrs & older 09/11/2023   PFIZER(Purple Top)SARS-COV-2 Vaccination 01/12/2020, 02/07/2020, 10/22/2020   Pfizer Covid-19 Vaccine Bivalent Booster 48yrs & up 10/03/2021   Pneumococcal Conjugate-13 06/01/2019, 06/01/2019   Pneumococcal Polysaccharide-23 09/29/2021   Pneumococcal-Unspecified 07/14/2012   RSV,unspecified 09/01/2022   Tdap 05/30/2010, 05/18/2019, 05/18/2019  Unspecified SARS-COV-2 Vaccination 09/01/2022   Zoster Recombinant(Shingrix ) 06/24/2021, 11/05/2021    Screening Tests Health Maintenance  Topic Date Due   COVID-19 Vaccine (7 - Pfizer risk 2024-25 season) 03/10/2024   INFLUENZA VACCINE  06/09/2024   Medicare Annual Wellness (AWV)  03/15/2025   DTaP/Tdap/Td (4 - Td or Tdap) 05/17/2029    Pneumonia Vaccine 49+ Years old  Completed   Zoster Vaccines- Shingrix   Completed   HPV VACCINES  Aged Out   Meningococcal B Vaccine  Aged Out   Hepatitis C Screening  Discontinued    Health Maintenance  Health Maintenance Due  Topic Date Due   COVID-19 Vaccine (7 - Pfizer risk 2024-25 season) 03/10/2024   Health Maintenance Items Addressed: Up to date  Additional Screening:  Vision Screening: Recommended annual ophthalmology exams for early detection of glaucoma and other disorders of the eye.  Dental Screening: Recommended annual dental exams for proper oral hygiene  Community Resource Referral / Chronic Care Management: CRR required this visit?  No   CCM required this visit?  No     Plan:     I have personally reviewed and noted the following in the patient's chart:   Medical and social history Use of alcohol, tobacco or illicit drugs  Current medications and supplements including opioid prescriptions. Patient is not currently taking opioid prescriptions. Functional ability and status Nutritional status Physical activity Advanced directives List of other physicians Hospitalizations, surgeries, and ER visits in previous 12 months Vitals Screenings to include cognitive, depression, and falls Referrals and appointments  In addition, I have reviewed and discussed with patient certain preventive protocols, quality metrics, and best practice recommendations. A written personalized care plan for preventive services as well as general preventive health recommendations were provided to patient.     Areatha Beecham, LPN   11/14/1094   After Visit Summary: (In Person-Printed) AVS printed and given to the patient  Notes: Nothing significant to report at this time.

## 2024-04-12 ENCOUNTER — Ambulatory Visit: Payer: Self-pay | Admitting: Nurse Practitioner

## 2024-04-19 ENCOUNTER — Other Ambulatory Visit: Payer: Self-pay | Admitting: Cardiovascular Disease

## 2024-05-02 DIAGNOSIS — N184 Chronic kidney disease, stage 4 (severe): Secondary | ICD-10-CM | POA: Diagnosis not present

## 2024-05-11 ENCOUNTER — Telehealth (HOSPITAL_COMMUNITY): Payer: Self-pay

## 2024-05-11 DIAGNOSIS — E1122 Type 2 diabetes mellitus with diabetic chronic kidney disease: Secondary | ICD-10-CM | POA: Diagnosis not present

## 2024-05-11 DIAGNOSIS — I129 Hypertensive chronic kidney disease with stage 1 through stage 4 chronic kidney disease, or unspecified chronic kidney disease: Secondary | ICD-10-CM | POA: Diagnosis not present

## 2024-05-11 DIAGNOSIS — E875 Hyperkalemia: Secondary | ICD-10-CM | POA: Diagnosis not present

## 2024-05-11 DIAGNOSIS — R7303 Prediabetes: Secondary | ICD-10-CM | POA: Diagnosis not present

## 2024-05-11 DIAGNOSIS — N184 Chronic kidney disease, stage 4 (severe): Secondary | ICD-10-CM | POA: Diagnosis not present

## 2024-05-11 DIAGNOSIS — D631 Anemia in chronic kidney disease: Secondary | ICD-10-CM | POA: Diagnosis not present

## 2024-05-11 DIAGNOSIS — Z87442 Personal history of urinary calculi: Secondary | ICD-10-CM | POA: Diagnosis not present

## 2024-05-11 DIAGNOSIS — R809 Proteinuria, unspecified: Secondary | ICD-10-CM | POA: Diagnosis not present

## 2024-05-11 NOTE — Telephone Encounter (Signed)
 Auth Submission: NO AUTH NEEDED Site of care: Site of care: [facility] Payer: [employer] Medicare Medication & CPT/J Code(s) submitted: Venofer (Iron Sucrose) J1756 Diagnosis Code: D63.1 Route of submission (phone, fax, portal):  Phone # Fax # Auth type: Buy/Bill HB Units/visits requested: 100mg  IV x 1 dose Reference number:  Approval from: 05/11/24 to 09/11/24

## 2024-05-19 ENCOUNTER — Telehealth (HOSPITAL_COMMUNITY): Payer: Self-pay | Admitting: Pharmacy Technician

## 2024-05-19 NOTE — Telephone Encounter (Signed)
 Auth Submission: PENDING Site of care: Site of care: [facility] Payer: [employer] MEDICARE Medication & CPT/J Code(s) submitted: Q5106 RETACRIT Diagnosis Code: N18.4, D63.1 Route of submission (phone, fax, portal): portal Phone # Fax # Auth type: Buy/Bill HB Units/visits requested: 10000u q 2 weeks Reference number: [id number] Trans ID: [id number]   Dagoberto Armour, CPhT [facility] [phone]

## 2024-05-22 ENCOUNTER — Encounter: Payer: Self-pay | Admitting: Nurse Practitioner

## 2024-05-24 ENCOUNTER — Other Ambulatory Visit: Payer: Self-pay | Admitting: Nurse Practitioner

## 2024-05-24 DIAGNOSIS — N183 Chronic kidney disease, stage 3 unspecified: Secondary | ICD-10-CM

## 2024-05-29 ENCOUNTER — Other Ambulatory Visit (HOSPITAL_COMMUNITY): Payer: Self-pay | Admitting: *Deleted

## 2024-05-30 ENCOUNTER — Ambulatory Visit (HOSPITAL_COMMUNITY)
Admission: RE | Admit: 2024-05-30 | Discharge: 2024-05-30 | Disposition: A | Discharge status: Home / Self Care | Source: Ambulatory Visit | Attending: Nephrology | Admitting: Nephrology

## 2024-05-30 VITALS — BP 205/57 | HR 48 | Temp 98.1°F | Resp 16 | Wt 176.0 lb

## 2024-05-30 DIAGNOSIS — N184 Chronic kidney disease, stage 4 (severe): Secondary | ICD-10-CM | POA: Insufficient documentation

## 2024-05-30 DIAGNOSIS — D631 Anemia in chronic kidney disease: Secondary | ICD-10-CM | POA: Diagnosis not present

## 2024-05-30 LAB — POCT HEMOGLOBIN-HEMACUE: Hemoglobin: 8.2 g/dL — ABNORMAL LOW (ref 13.0–17.0)

## 2024-05-30 MED ORDER — EPOETIN ALFA-EPBX 10000 UNIT/ML IJ SOLN
INTRAMUSCULAR | Status: AC
  Filled 2024-05-30: qty 1

## 2024-05-30 MED ORDER — SODIUM CHLORIDE 0.9 % IV SOLN
100.0000 mg | Freq: Once | INTRAVENOUS | Status: AC
  Administered 2024-05-30: 100 mg via INTRAVENOUS
  Filled 2024-05-30: qty 5

## 2024-05-30 MED ORDER — EPOETIN ALFA-EPBX 10000 UNIT/ML IJ SOLN
10000.0000 [IU] | INTRAMUSCULAR | Status: DC
  Administered 2024-05-30: 10000 [IU] via SUBCUTANEOUS

## 2024-06-13 ENCOUNTER — Encounter (HOSPITAL_COMMUNITY)
Admission: RE | Admit: 2024-06-13 | Discharge: 2024-06-13 | Disposition: A | Discharge status: Home / Self Care | Source: Ambulatory Visit | Attending: Nephrology | Admitting: Nephrology

## 2024-06-13 VITALS — BP 180/59 | HR 45 | Temp 97.4°F | Resp 16

## 2024-06-13 DIAGNOSIS — N184 Chronic kidney disease, stage 4 (severe): Secondary | ICD-10-CM | POA: Insufficient documentation

## 2024-06-13 DIAGNOSIS — D631 Anemia in chronic kidney disease: Secondary | ICD-10-CM | POA: Insufficient documentation

## 2024-06-13 LAB — POCT HEMOGLOBIN-HEMACUE: Hemoglobin: 10 g/dL — ABNORMAL LOW (ref 13.0–17.0)

## 2024-06-13 MED ORDER — EPOETIN ALFA-EPBX 10000 UNIT/ML IJ SOLN
INTRAMUSCULAR | Status: AC
  Filled 2024-06-13: qty 2

## 2024-06-13 MED ORDER — EPOETIN ALFA-EPBX 10000 UNIT/ML IJ SOLN
20000.0000 [IU] | INTRAMUSCULAR | Status: DC
  Administered 2024-06-13: 20000 [IU] via SUBCUTANEOUS

## 2024-06-27 ENCOUNTER — Encounter (HOSPITAL_COMMUNITY)
Admission: RE | Admit: 2024-06-27 | Discharge: 2024-06-27 | Disposition: A | Discharge status: Home / Self Care | Source: Ambulatory Visit | Attending: Nephrology | Admitting: Nephrology

## 2024-06-27 VITALS — BP 176/56 | HR 54 | Temp 97.3°F | Resp 18

## 2024-06-27 DIAGNOSIS — D631 Anemia in chronic kidney disease: Secondary | ICD-10-CM | POA: Diagnosis not present

## 2024-06-27 DIAGNOSIS — N184 Chronic kidney disease, stage 4 (severe): Secondary | ICD-10-CM | POA: Diagnosis not present

## 2024-06-27 LAB — IRON AND TIBC
Iron: 56 ug/dL (ref 45–182)
Saturation Ratios: 18 % (ref 17.9–39.5)
TIBC: 318 ug/dL (ref 250–450)
UIBC: 262 ug/dL

## 2024-06-27 LAB — FERRITIN: Ferritin: 180 ng/mL (ref 24–336)

## 2024-06-27 LAB — POCT HEMOGLOBIN-HEMACUE: Hemoglobin: 10.2 g/dL — ABNORMAL LOW (ref 13.0–17.0)

## 2024-06-27 MED ORDER — EPOETIN ALFA-EPBX 10000 UNIT/ML IJ SOLN
INTRAMUSCULAR | Status: AC
Start: 2024-06-27 — End: 2024-06-27
  Filled 2024-06-27: qty 2

## 2024-06-27 MED ORDER — EPOETIN ALFA-EPBX 10000 UNIT/ML IJ SOLN
20000.0000 [IU] | INTRAMUSCULAR | Status: DC
  Administered 2024-06-27: 20000 [IU] via SUBCUTANEOUS

## 2024-07-11 ENCOUNTER — Encounter (HOSPITAL_COMMUNITY)
Admission: RE | Admit: 2024-07-11 | Discharge: 2024-07-11 | Disposition: A | Discharge status: Home / Self Care | Source: Ambulatory Visit | Attending: Nephrology | Admitting: Nephrology

## 2024-07-11 VITALS — BP 177/59 | HR 52 | Temp 97.1°F | Resp 16

## 2024-07-11 DIAGNOSIS — D631 Anemia in chronic kidney disease: Secondary | ICD-10-CM | POA: Diagnosis not present

## 2024-07-11 DIAGNOSIS — N184 Chronic kidney disease, stage 4 (severe): Secondary | ICD-10-CM | POA: Diagnosis not present

## 2024-07-11 LAB — POCT HEMOGLOBIN-HEMACUE: Hemoglobin: 11.6 g/dL — ABNORMAL LOW (ref 13.0–17.0)

## 2024-07-11 MED ORDER — EPOETIN ALFA-EPBX 20000 UNIT/ML IJ SOLN
INTRAMUSCULAR | Status: AC
  Filled 2024-07-11: qty 1

## 2024-07-11 MED ORDER — EPOETIN ALFA-EPBX 20000 UNIT/ML IJ SOLN
20000.0000 [IU] | INTRAMUSCULAR | Status: DC
  Administered 2024-07-11: 20000 [IU] via SUBCUTANEOUS

## 2024-07-18 DIAGNOSIS — D631 Anemia in chronic kidney disease: Secondary | ICD-10-CM | POA: Insufficient documentation

## 2024-07-21 ENCOUNTER — Other Ambulatory Visit (HOSPITAL_COMMUNITY): Payer: Self-pay | Admitting: Nephrology

## 2024-07-25 ENCOUNTER — Encounter (HOSPITAL_COMMUNITY)
Admission: RE | Admit: 2024-07-25 | Discharge: 2024-07-25 | Disposition: A | Discharge status: Home / Self Care | Source: Ambulatory Visit | Attending: Nephrology | Admitting: Nephrology

## 2024-07-25 VITALS — BP 170/57 | HR 57 | Temp 97.3°F | Resp 16

## 2024-07-25 DIAGNOSIS — D631 Anemia in chronic kidney disease: Secondary | ICD-10-CM

## 2024-07-25 DIAGNOSIS — N184 Chronic kidney disease, stage 4 (severe): Secondary | ICD-10-CM | POA: Diagnosis not present

## 2024-07-25 LAB — IRON AND TIBC
Iron: 72 ug/dL (ref 45–182)
Saturation Ratios: 22 % (ref 17.9–39.5)
TIBC: 326 ug/dL (ref 250–450)
UIBC: 254 ug/dL

## 2024-07-25 LAB — FERRITIN: Ferritin: 139 ng/mL (ref 24–336)

## 2024-07-25 MED ORDER — EPOETIN ALFA-EPBX 10000 UNIT/ML IJ SOLN
INTRAMUSCULAR | Status: AC
  Filled 2024-07-25: qty 1

## 2024-07-25 MED ORDER — EPOETIN ALFA-EPBX 10000 UNIT/ML IJ SOLN: 10000.0000 [IU] | Freq: Once | INTRAMUSCULAR | Status: DC

## 2024-07-25 MED ORDER — EPOETIN ALFA-EPBX 20000 UNIT/ML IJ SOLN
20000.0000 [IU] | Freq: Once | INTRAMUSCULAR | Status: AC
  Administered 2024-07-25: 20000 [IU] via SUBCUTANEOUS

## 2024-07-26 LAB — POCT HEMOGLOBIN-HEMACUE: Hemoglobin: 11.4 g/dL — ABNORMAL LOW (ref 13.0–17.0)

## 2024-08-01 ENCOUNTER — Other Ambulatory Visit (HOSPITAL_COMMUNITY): Payer: Self-pay | Admitting: Nephrology

## 2024-08-01 DIAGNOSIS — N189 Chronic kidney disease, unspecified: Secondary | ICD-10-CM | POA: Insufficient documentation

## 2024-08-01 DIAGNOSIS — N184 Chronic kidney disease, stage 4 (severe): Secondary | ICD-10-CM | POA: Diagnosis not present

## 2024-08-01 DIAGNOSIS — D631 Anemia in chronic kidney disease: Secondary | ICD-10-CM

## 2024-08-04 ENCOUNTER — Other Ambulatory Visit (HOSPITAL_COMMUNITY): Payer: Self-pay | Admitting: Nephrology

## 2024-08-08 ENCOUNTER — Encounter (HOSPITAL_COMMUNITY): Payer: Self-pay

## 2024-08-08 ENCOUNTER — Inpatient Hospital Stay (HOSPITAL_COMMUNITY): Admission: RE | Admit: 2024-08-08 | Source: Ambulatory Visit

## 2024-08-11 ENCOUNTER — Encounter (HOSPITAL_COMMUNITY)
Admission: RE | Admit: 2024-08-11 | Discharge: 2024-08-11 | Disposition: A | Discharge status: Home / Self Care | Source: Ambulatory Visit | Attending: Nephrology | Admitting: Nephrology

## 2024-08-11 DIAGNOSIS — N183 Chronic kidney disease, stage 3 unspecified: Secondary | ICD-10-CM | POA: Insufficient documentation

## 2024-08-11 DIAGNOSIS — D631 Anemia in chronic kidney disease: Secondary | ICD-10-CM | POA: Insufficient documentation

## 2024-08-11 DIAGNOSIS — N184 Chronic kidney disease, stage 4 (severe): Secondary | ICD-10-CM | POA: Insufficient documentation

## 2024-08-11 NOTE — Progress Notes (Signed)
 Patient's orders changed to every 4 weeks, came in today but was too early. Already scheduled in 2 more weeks for IV Venofer . Will change appt for patient to receive both on 08/25/24. Patient verbalized understanding.

## 2024-08-17 DIAGNOSIS — Z87442 Personal history of urinary calculi: Secondary | ICD-10-CM | POA: Diagnosis not present

## 2024-08-17 DIAGNOSIS — E1122 Type 2 diabetes mellitus with diabetic chronic kidney disease: Secondary | ICD-10-CM | POA: Diagnosis not present

## 2024-08-17 DIAGNOSIS — R7303 Prediabetes: Secondary | ICD-10-CM | POA: Diagnosis not present

## 2024-08-17 DIAGNOSIS — D631 Anemia in chronic kidney disease: Secondary | ICD-10-CM | POA: Diagnosis not present

## 2024-08-17 DIAGNOSIS — I129 Hypertensive chronic kidney disease with stage 1 through stage 4 chronic kidney disease, or unspecified chronic kidney disease: Secondary | ICD-10-CM | POA: Diagnosis not present

## 2024-08-17 DIAGNOSIS — N184 Chronic kidney disease, stage 4 (severe): Secondary | ICD-10-CM | POA: Diagnosis not present

## 2024-08-17 DIAGNOSIS — E875 Hyperkalemia: Secondary | ICD-10-CM | POA: Diagnosis not present

## 2024-08-17 DIAGNOSIS — R809 Proteinuria, unspecified: Secondary | ICD-10-CM | POA: Diagnosis not present

## 2024-08-18 LAB — LAB REPORT - SCANNED
Albumin, Urine POC: 1288.3
Creatinine, POC: 33.1 mg/dL
Microalb Creat Ratio: 3892

## 2024-08-22 ENCOUNTER — Encounter (HOSPITAL_COMMUNITY)

## 2024-08-25 ENCOUNTER — Ambulatory Visit (HOSPITAL_COMMUNITY)
Admission: RE | Admit: 2024-08-25 | Discharge: 2024-08-25 | Disposition: A | Discharge status: Home / Self Care | Source: Ambulatory Visit | Attending: Nephrology | Admitting: Nephrology

## 2024-08-25 ENCOUNTER — Encounter (HOSPITAL_COMMUNITY)
Admission: RE | Admit: 2024-08-25 | Discharge: 2024-08-25 | Disposition: A | Source: Ambulatory Visit | Attending: Nephrology

## 2024-08-25 VITALS — BP 187/66 | HR 50 | Temp 97.9°F | Resp 16

## 2024-08-25 DIAGNOSIS — N184 Chronic kidney disease, stage 4 (severe): Secondary | ICD-10-CM | POA: Insufficient documentation

## 2024-08-25 DIAGNOSIS — D631 Anemia in chronic kidney disease: Secondary | ICD-10-CM

## 2024-08-25 DIAGNOSIS — N183 Chronic kidney disease, stage 3 unspecified: Secondary | ICD-10-CM | POA: Diagnosis not present

## 2024-08-25 LAB — POCT HEMOGLOBIN-HEMACUE: Hemoglobin: 11.6 g/dL — ABNORMAL LOW (ref 13.0–17.0)

## 2024-08-25 MED ORDER — IRON SUCROSE 200 MG IVPB - SIMPLE MED
200.0000 mg | Freq: Once | Status: AC
  Administered 2024-08-25: 200 mg via INTRAVENOUS
  Filled 2024-08-25: qty 200

## 2024-08-25 MED ORDER — EPOETIN ALFA-EPBX 10000 UNIT/ML IJ SOLN
INTRAMUSCULAR | Status: AC
  Filled 2024-08-25: qty 1

## 2024-08-25 MED ORDER — EPOETIN ALFA-EPBX 10000 UNIT/ML IJ SOLN
10000.0000 [IU] | Freq: Once | INTRAMUSCULAR | Status: AC
  Administered 2024-08-25: 10000 [IU] via SUBCUTANEOUS

## 2024-08-25 MED FILL — Epoetin Alfa-epbx Inj 10000 Unit/ML: INTRAMUSCULAR | Qty: 1 | Status: AC

## 2024-08-29 ENCOUNTER — Other Ambulatory Visit (HOSPITAL_COMMUNITY): Payer: Self-pay | Admitting: Nephrology

## 2024-08-30 DIAGNOSIS — N184 Chronic kidney disease, stage 4 (severe): Secondary | ICD-10-CM | POA: Diagnosis not present

## 2024-09-01 DIAGNOSIS — E785 Hyperlipidemia, unspecified: Secondary | ICD-10-CM | POA: Diagnosis not present

## 2024-09-01 DIAGNOSIS — D509 Iron deficiency anemia, unspecified: Secondary | ICD-10-CM | POA: Diagnosis not present

## 2024-09-01 DIAGNOSIS — Z8249 Family history of ischemic heart disease and other diseases of the circulatory system: Secondary | ICD-10-CM | POA: Diagnosis not present

## 2024-09-01 DIAGNOSIS — I2510 Atherosclerotic heart disease of native coronary artery without angina pectoris: Secondary | ICD-10-CM | POA: Diagnosis not present

## 2024-09-01 DIAGNOSIS — N2581 Secondary hyperparathyroidism of renal origin: Secondary | ICD-10-CM | POA: Diagnosis not present

## 2024-09-01 DIAGNOSIS — I509 Heart failure, unspecified: Secondary | ICD-10-CM | POA: Diagnosis not present

## 2024-09-01 DIAGNOSIS — N400 Enlarged prostate without lower urinary tract symptoms: Secondary | ICD-10-CM | POA: Diagnosis not present

## 2024-09-01 DIAGNOSIS — I130 Hypertensive heart and chronic kidney disease with heart failure and stage 1 through stage 4 chronic kidney disease, or unspecified chronic kidney disease: Secondary | ICD-10-CM | POA: Diagnosis not present

## 2024-09-01 DIAGNOSIS — N184 Chronic kidney disease, stage 4 (severe): Secondary | ICD-10-CM | POA: Diagnosis not present

## 2024-09-08 ENCOUNTER — Encounter (HOSPITAL_COMMUNITY)

## 2024-09-11 ENCOUNTER — Encounter: Payer: Self-pay | Admitting: Nurse Practitioner

## 2024-09-11 ENCOUNTER — Telehealth: Payer: Self-pay | Admitting: Nurse Practitioner

## 2024-09-11 NOTE — Telephone Encounter (Signed)
 Called pt to reschedule Missed 11/3 appt no answer left VM

## 2024-09-11 NOTE — Progress Notes (Deleted)
 LILLETTE Kristeen JINNY Gladis, CMA,acting as a neurosurgeon for Eddie Ada, FNP.,have documented all relevant documentation on the behalf of Eddie Ada, FNP,as directed by  Eddie Ada, FNP while in the presence of Eddie Ada, FNP.  Subjective:   Patient ID: Eddie Hutchinson , male    DOB: 07-17-1941 , 83 y.o.   MRN: [id number]  No chief complaint on file.   HPI  HPI   Past Medical History:  Diagnosis Date   Abnormal EKG 11/24/2016   LAFB with LVH and QRS widening   Benign essential HTN 11/24/2016   Family history of early CAD 11/24/2016   Glucose intolerance (impaired glucose tolerance)    Hyperlipidemia 11/24/2016   Hypertension    Kidney stone    PVC's (premature ventricular contractions) 11/24/2016   Vitamin D  deficiency      Family History  Problem Relation Age of Onset   Heart failure Mother    Heart attack Father 67   Heart disease Father    CVA Brother    Cancer Brother      Current Outpatient Medications:    amLODipine  (NORVASC ) 10 MG tablet, TAKE 1 TABLET BY MOUTH EVERY DAY, Disp: 90 tablet, Rfl: 1   carvedilol  (COREG ) 3.125 MG tablet, TAKE 1 TABLET BY MOUTH TWICE A DAY, Disp: 180 tablet, Rfl: 2   dapagliflozin propanediol (FARXIGA) 5 MG TABS tablet, Take 5 mg by mouth daily., Disp: , Rfl:    Ergocalciferol  (VITAMIN D2 PO), Take 1,000 Units by mouth daily. , Disp: , Rfl:    fenofibrate  160 MG tablet, Take 1 tablet (160 mg total) by mouth daily., Disp: 90 tablet, Rfl: 1   ferrous sulfate  325 (65 FE) MG tablet, TAKE 1 TABLET BY MOUTH EVERY DAY, Disp: 90 tablet, Rfl: 1   furosemide  (LASIX ) 40 MG tablet, Take 40 mg by mouth daily., Disp: , Rfl:    hydrALAZINE  (APRESOLINE ) 100 MG tablet, Take 100 mg by mouth 3 (three) times daily., Disp: , Rfl:    isosorbide  mononitrate (IMDUR ) 30 MG 24 hr tablet, Take 2 tablets by mouth daily., Disp: , Rfl:    losartan (COZAAR) 50 MG tablet, Take 50 mg by mouth daily., Disp: , Rfl:    pantoprazole  (PROTONIX ) 40 MG tablet, Take 1 tablet (40 mg  total) by mouth daily., Disp: 30 tablet, Rfl: 0   Allergies  Allergen Reactions   Bee Venom Hives    As a child      Men's preventive visit. Patient Health Questionnaire (PHQ-2) is  Flowsheet Row Clinical Support from 03/15/2024 in [facility]  PHQ-2 Total Score 0  . Patient is on a *** diet. Marital status: Widowed. Relevant history for alcohol use is:  Social History   Substance and Sexual Activity  Alcohol Use Yes   Comment: occassionally  . Relevant history for tobacco use is:  Social History   Tobacco Use  Smoking Status Former  Smokeless Tobacco Never  .   Review of Systems   There were no vitals filed for this visit. There is no height or weight on file to calculate BMI.  Wt Readings from Last 3 Encounters:  05/30/24 176 lb (79.8 kg)  03/15/24 179 lb (81.2 kg)  03/02/24 180 lb 3.2 oz (81.7 kg)    Objective:  Physical Exam      Assessment And Plan:    Encounter for annual health examination  Benign hypertension with chronic kidney disease, stage III (HCC)  Mixed hyperlipidemia  Vitamin D  deficiency  Abnormal glucose     No follow-ups on file. Patient was given opportunity to ask questions. Patient verbalized understanding of the plan and was able to repeat key elements of the plan. All questions were answered to their satisfaction.   Eddie Ada, FNP  I, Eddie Ada, FNP, have reviewed all documentation for this visit. The documentation on 09/11/24 for the exam, diagnosis, procedures, and orders are all accurate and complete.

## 2024-09-22 ENCOUNTER — Encounter (HOSPITAL_COMMUNITY)

## 2024-10-21 ENCOUNTER — Other Ambulatory Visit: Payer: Self-pay | Admitting: Nurse Practitioner

## 2024-11-01 ENCOUNTER — Other Ambulatory Visit: Payer: Self-pay | Admitting: Nurse Practitioner

## 2024-11-01 DIAGNOSIS — E782 Mixed hyperlipidemia: Secondary | ICD-10-CM

## 2025-04-25 ENCOUNTER — Ambulatory Visit: Payer: Self-pay
